# Patient Record
Sex: Female | Born: 1960 | Race: White | Hispanic: No | Marital: Married | Smoking: Never smoker
Health system: Southern US, Community
[De-identification: ages and names within clinical notes are randomized; demographics above are authoritative.]

## PROBLEM LIST (undated history)

## (undated) DIAGNOSIS — R928 Other abnormal and inconclusive findings on diagnostic imaging of breast: Secondary | ICD-10-CM

## (undated) DIAGNOSIS — R51 Headache: Secondary | ICD-10-CM

## (undated) DIAGNOSIS — R519 Headache, unspecified: Secondary | ICD-10-CM

## (undated) DIAGNOSIS — N6320 Unspecified lump in the left breast, unspecified quadrant: Secondary | ICD-10-CM

## (undated) DIAGNOSIS — F32A Depression, unspecified: Secondary | ICD-10-CM

## (undated) DIAGNOSIS — F419 Anxiety disorder, unspecified: Secondary | ICD-10-CM

## (undated) DIAGNOSIS — F329 Major depressive disorder, single episode, unspecified: Secondary | ICD-10-CM

## (undated) HISTORY — PX: TUBAL LIGATION: SHX77

---

## 1988-12-12 HISTORY — PX: ABDOMINAL HYSTERECTOMY: SHX81

## 2012-10-29 LAB — HM MAMMOGRAPHY

## 2014-07-10 ENCOUNTER — Ambulatory Visit (INDEPENDENT_AMBULATORY_CARE_PROVIDER_SITE_OTHER): Payer: BC Managed Care – PPO | Admitting: Internal Medicine

## 2014-07-10 ENCOUNTER — Encounter: Payer: Self-pay | Admitting: Internal Medicine

## 2014-07-10 VITALS — BP 112/84 | HR 70 | Temp 97.9°F | Ht 64.5 in | Wt 170.0 lb

## 2014-07-10 DIAGNOSIS — N9089 Other specified noninflammatory disorders of vulva and perineum: Secondary | ICD-10-CM

## 2014-07-10 DIAGNOSIS — Z780 Asymptomatic menopausal state: Secondary | ICD-10-CM

## 2014-07-10 DIAGNOSIS — Z0000 Encounter for general adult medical examination without abnormal findings: Secondary | ICD-10-CM

## 2014-07-10 LAB — CBC
HCT: 39.3 % (ref 36.0–46.0)
Hemoglobin: 13.1 g/dL (ref 12.0–15.0)
MCHC: 33.3 g/dL (ref 30.0–36.0)
MCV: 88.6 fl (ref 78.0–100.0)
Platelets: 246 10*3/uL (ref 150.0–400.0)
RBC: 4.44 Mil/uL (ref 3.87–5.11)
RDW: 13 % (ref 11.5–15.5)
WBC: 7.2 10*3/uL (ref 4.0–10.5)

## 2014-07-10 LAB — LIPID PANEL
CHOLESTEROL: 206 mg/dL — AB (ref 0–200)
HDL: 53.4 mg/dL (ref >39.00)
LDL Cholesterol: 137 mg/dL — ABNORMAL HIGH (ref 0–99)
NONHDL: 152.6
Total CHOL/HDL Ratio: 4
Triglycerides: 77 mg/dL (ref 0.0–149.0)
VLDL: 15.4 mg/dL (ref 0.0–40.0)

## 2014-07-10 LAB — COMPREHENSIVE METABOLIC PANEL
ALBUMIN: 3.9 g/dL (ref 3.5–5.2)
ALT: 22 U/L (ref 0–35)
AST: 24 U/L (ref 0–37)
Alkaline Phosphatase: 69 U/L (ref 39–117)
BUN: 12 mg/dL (ref 6–23)
CALCIUM: 9 mg/dL (ref 8.4–10.5)
CO2: 26 meq/L (ref 19–32)
Chloride: 105 mEq/L (ref 96–112)
Creatinine, Ser: 0.7 mg/dL (ref 0.4–1.2)
GFR: 99.11 mL/min (ref >60.00)
GLUCOSE: 75 mg/dL (ref 70–99)
POTASSIUM: 3.7 meq/L (ref 3.5–5.1)
SODIUM: 139 meq/L (ref 135–145)
TOTAL PROTEIN: 7.1 g/dL (ref 6.0–8.3)
Total Bilirubin: 0.5 mg/dL (ref 0.2–1.2)

## 2014-07-10 LAB — TSH: TSH: 0.77 u[IU]/mL (ref 0.35–4.50)

## 2014-07-10 MED ORDER — ESTRADIOL 0.1 MG/24HR TD PTTW: 1.0000 | MEDICATED_PATCH | TRANSDERMAL | Status: DC

## 2014-07-10 NOTE — Progress Notes (Signed)
Pre visit review using our clinic review tool, if applicable. No additional management support is needed unless otherwise documented below in the visit note. 

## 2014-07-10 NOTE — Patient Instructions (Addendum)

## 2014-07-10 NOTE — Assessment & Plan Note (Signed)
Refilled vivelle patch

## 2014-07-10 NOTE — Progress Notes (Signed)
HPI  Pt presents to the clinic today to establish care. She recently moved from [place].  Flu: never Tetanus: > 10 years ago Pap Smear: 2013 Mammogram: 2013 Colon Screening: never Eye Doctor: 2013 Dentist: bianually  She does request a refill of her vivelle patch today.  Additionally, she does have some concerns today about genital herpes. She reports a few weeks back, she went to [facility] for evaluation of a labial lesion. They did a culture which came back inconclusive for HSV. She is wondering today if there is any confirmatory testing that can be done. She has no active lesion at this time.  No past medical history on file.  Current Outpatient Prescriptions  Medication Sig Dispense Refill  . estradiol (VIVELLE-DOT) 0.1 MG/24HR patch Place 1 patch onto the skin 2 (two) times a week.       No current facility-administered medications for this visit.    No Known Allergies  Family History  Problem Relation Age of Onset  . Alcohol abuse Mother   . Heart disease Father     History   Social History  . Marital Status: Married    Spouse Name: N/A    Number of Children: N/A  . Years of Education: N/A   Occupational History  . Not on file.   Social History Main Topics  . Smoking status: Never Smoker   . Smokeless tobacco: Never Used  . Alcohol Use: Yes     Comment: occasional  . Drug Use: Not on file  . Sexual Activity: Not on file   Other Topics Concern  . Not on file   Social History Narrative  . No narrative on file    ROS:  Constitutional: Denies fever, malaise, fatigue, headache or abrupt weight changes.  Respiratory: Denies difficulty breathing, shortness of breath, cough or sputum production.   Cardiovascular: Denies chest pain, chest tightness, palpitations or swelling in the hands or feet.  Gastrointestinal: Denies abdominal pain, bloating, constipation, diarrhea or blood in the stool.  GU: Denies frequency, urgency, pain with urination, blood in urine,  odor or discharge. Skin: Denies redness, rashes, lesions or ulcercations.   No other specific complaints in a complete review of systems (except as listed in HPI above).  PE:  BP 112/84  Pulse 70  Temp(Src) 97.9 F (36.6 C) (Oral)  Ht 5' 4.5" (1.638 m)  Wt 170 lb (77.111 kg)  BMI 28.74 kg/m2  SpO2 98% Wt Readings from Last 3 Encounters:  07/10/14 170 lb (77.111 kg)    General: Appears her stated age, well developed, well nourished in NAD. HEENT: Head: normal shape and size; Eyes: sclera white, no icterus, conjunctiva pink, PERRLA and EOMs intact; Ears: Tm's gray and intact, normal light reflex; Nose: mucosa pink and moist, septum midline; Throat/Mouth: Teeth present, mucosa pink and moist, no lesions or ulcerations noted.  Neck: Normal range of motion. Neck supple, trachea midline. No massses, lumps or thyromegaly present.  Cardiovascular: Normal rate and rhythm. S1,S2 noted.  No murmur, rubs or gallops noted. No JVD or BLE edema. No carotid bruits noted. Pulmonary/Chest: Normal effort and positive vesicular breath sounds. No respiratory distress. No wheezes, rales or ronchi noted.  Abdomen: Soft and nontender. Normal bowel sounds, no bruits noted. No distention or masses noted. Liver, spleen and kidneys non palpable. Musculoskeletal: Normal range of motion. No signs of joint swelling. No difficulty with gait.  Neurological: Alert and oriented. Cranial nerves grossly intact. Coordination normal.  Psychiatric: Mood and affect normal. Behavior is normal.  Judgment and thought content normal.      Assessment and Plan:  Preventative Health Maintenance:  She declines all prophylactic vaccines She does not want to schedule mammogram or colon screening at this time Encouraged her to work on diet and exercise Will check screening labs today including CBC, CMET, TSH and lipids  Labial lesion:  Will check serum HSV level  RTC in 1 year or sooner if needed.

## 2014-07-11 LAB — HSV(HERPES SMPLX)ABS-I+II(IGG+IGM)-BLD
HSV 1 Glycoprotein G Ab, IgG: 5.87 IV — ABNORMAL HIGH
HSV 2 GLYCOPROTEIN G AB, IGG: 9.35 IV — AB
Herpes Simplex Vrs I&II-IgM Ab (EIA): 1.35 INDEX — ABNORMAL HIGH

## 2014-09-24 ENCOUNTER — Encounter: Payer: Self-pay | Admitting: Internal Medicine

## 2015-01-13 ENCOUNTER — Telehealth: Payer: Self-pay | Admitting: Internal Medicine

## 2015-01-13 ENCOUNTER — Ambulatory Visit (INDEPENDENT_AMBULATORY_CARE_PROVIDER_SITE_OTHER): Payer: BC Managed Care – PPO | Admitting: Family Medicine

## 2015-01-13 ENCOUNTER — Encounter: Payer: Self-pay | Admitting: Family Medicine

## 2015-01-13 VITALS — BP 120/78 | HR 68 | Temp 97.9°F | Wt 174.5 lb

## 2015-01-13 DIAGNOSIS — M546 Pain in thoracic spine: Secondary | ICD-10-CM

## 2015-01-13 MED ORDER — CYCLOBENZAPRINE HCL 10 MG PO TABS: 5.0000 mg | ORAL_TABLET | Freq: Three times a day (TID) | ORAL | Status: DC | PRN

## 2015-01-13 NOTE — Progress Notes (Signed)
Pre visit review using our clinic review tool, if applicable. No additional management support is needed unless otherwise documented below in the visit note.   Mid pain back, B, horizontal distributions.  Last 3 days.  7/10 pain now, was worse prev.  Aleve didn't help much.  Now with L trap pain.  No FCNAVD.  No anxiety/panic sx.  No CP, no SOB.  No rash, no trauma.  Heat helps temporarily.  No dysuria.  No leg sx, no weakness.   Meds, vitals, and allergies reviewed.   ROS: See HPI.  Otherwise, noncontributory.  nad ncat Neck supple, but L trap and SCM ttp, no LA rrr ctab abd soft, not ttp Ext w/o edema B mid back with muscle spasms noted but no midline pain.  Normal S/S BLE

## 2015-01-13 NOTE — Patient Instructions (Signed)
Keep taking aleve with food, gently stretch, use the heating pad and take flexeril if needed.  It can make you drowsy.  This should gradually get better. Take care.

## 2015-01-13 NOTE — Telephone Encounter (Signed)
PLEASE NOTE: All timestamps contained within this report are represented as [place] Standard Time. CONFIDENTIALTY NOTICE: This fax transmission is intended only for the addressee. It contains information that is legally privileged, confidential or otherwise protected from use or disclosure. If you are not the intended recipient, you are strictly prohibited from reviewing, disclosing, copying using or disseminating any of this information or taking any action in reliance on or regarding this information. If you have received this fax in error, please notify us immediately by telephone so that we can arrange for its return to [place]. Phone: [phone], Toll-Free: [phone], Fax: [phone] Page: 1 of 2 Call Id: [id number] [place] Patient Name: Megan Mcdonald Gender: Female DOB: 05/31/1961 Age: 54 Y 17 M 20 D Return Phone Number: [phone] (Primary) Address: [address] City/State/Zip: [place] [address] Client [facility] - Client Client Site [place], [place] Type Call Call Type Triage / Clinical Relationship To Patient Self Appointment Disposition EMR Appointment Scheduled Return Phone Number [phone] (Primary) Chief Complaint Back Injury Initial Comment Caller states she has severe back pain, neck, and jaw pain. PreDisposition Go to Urgent Care/Walk-In Clinic Info pasted into Epic Yes Nurse Assessment Nurse: Mallie Mussel, RN, Alveta Heimlich Date/Time Eilene Ghazi Time): 01/13/2015 11:05:37 AM Confirm and document reason for call. If symptomatic, describe symptoms. ---Caller states that she has mid back pain for the past 3 days. It goes across her back, not up and down. She has left sided neck and jaw pain with this this morning. She denies back injury. She rates her pain as 7 on 0-10 scale but it has been  worse. She denies shooting pains and numbness in her arms. She denies unusual breathing and unusual sweating. Denies MI and Angina. She is able to touch her chin to her chest. Has the patient traveled out of the country within the last 30 days? ---No Does the patient require triage? ---Yes Related visit to physician within the last 2 weeks? ---No Does the PT have any chronic conditions? (i.e. diabetes, asthma, etc.) ---Yes List chronic conditions. ---Anxiety, HRT, Herpes Did the patient indicate they were pregnant? ---No Guidelines Guideline Title Affirmed Question Affirmed Notes Nurse Date/Time (Eastern Time) Back Pain [1] MODERATE back pain (e.g., interferes with normal activities) AND [2] present > 3 days Orlene Och 01/13/2015 11:11:38 AM Disp. Time Eilene Ghazi Time) Disposition Final User PLEASE NOTE: All timestamps contained within this report are represented as [place] Standard Time. CONFIDENTIALTY NOTICE: This fax transmission is intended only for the addressee. It contains information that is legally privileged, confidential or otherwise protected from use or disclosure. If you are not the intended recipient, you are strictly prohibited from reviewing, disclosing, copying using or disseminating any of this information or taking any action in reliance on or regarding this information. If you have received this fax in error, please notify us immediately by telephone so that we can arrange for its return to [place]. Phone: [phone], Toll-Free: [phone], Fax: [phone] Page: 2 of 2 Call Id: [id number] 01/13/2015 11:15:23 AM See PCP When Office is Open (within 3 days) Yes Mallie Mussel, RN, Ola Spurr Understands: Yes Disagree/Comply: Comply Care Advice Given Per Guideline SEE PCP WITHIN 3 DAYS: You need to be examined within 2 or 3 days. Call your doctor during regular office hours and make an appointment. (Note: if office will be open tomorrow, tell  caller to call then, not in 3  days). LOCAL HEAT: Apply a heating pad or hot water bottle to the most painful area for 20 minutes whenever the pain flares up. (Caution about burns.) SLEEP: Sleep on your side with a pillow between your knees. If you sleep on your back, place a pillow under your knees. Avoid sleeping on the abdomen. The mattress should be firm or reinforced with a board. Avoid waterbeds. * Continue ordinary activities as much as your pain permits. Continued activity is more healing for the back than rest. * Avoid any activities that cause severe pain. * Use caution and commonsense when performing heavy lifting. Similarly, be careful during strenuous exercise. IBUPROFEN (E.G., MOTRIN, ADVIL): NAPROXEN (E.G., ALEVE): * Do not take nonsteroidal anti-inflammatory drugs (NSAIDs) if you have stomach problems, kidney disease, heart failure, or other contraindications to using this type of medication. * Do not take NSAID medications for over 7 days without consulting your PCP. * GASTROINTESTINAL RISK: There is an increased risk of stomach ulcers, GI bleeding, perforation. * CARDIOVASCULAR RISK: There may be an increased risk of heart attack and stroke. * Numbness or weakness occurs, or bowel/bladder problems * There are any urine symptoms or fever * You become worse. After Care Instructions Given Call Event Type User Date / Time Description

## 2015-01-13 NOTE — Telephone Encounter (Signed)
Pt has appt with Dr Damita Dunnings today at 12:30 pm.

## 2015-01-13 NOTE — Telephone Encounter (Signed)
Patient Name: Megan Mcdonald  DOB: 1960-12-23    Initial Comment Caller states she has severe back pain, neck, and jaw pain.    Nurse Assessment  Nurse: Mallie Mussel, RN, Alveta Heimlich Date/Time Eilene Ghazi Time): 01/13/2015 11:05:37 AM  Confirm and document reason for call. If symptomatic, describe symptoms. ---Caller states that she has mid back pain for the past 3 days. It goes across her back, not up and down. She has left sided neck and jaw pain with this this morning. She denies back injury. She rates her pain as 7 on 0-10 scale but it has been worse. She denies shooting pains and numbness in her arms. She denies unusual breathing and unusual sweating. Denies MI and Angina. She is able to touch her chin to her chest.  Has the patient traveled out of the country within the last 30 days? ---No  Does the patient require triage? ---Yes  Related visit to physician within the last 2 weeks? ---No  Does the PT have any chronic conditions? (i.e. diabetes, asthma, etc.) ---Yes  List chronic conditions. ---Anxiety, HRT, Herpes  Did the patient indicate they were pregnant? ---No     Guidelines    Guideline Title Affirmed Question Affirmed Notes  Back Pain [1] MODERATE back pain (e.g., interferes with normal activities) AND [2] present > 3 days    Final Disposition User   See PCP When Office is Open (within 3 days) Mallie Mussel, Therapist, sports, Alveta Heimlich

## 2015-01-13 NOTE — Telephone Encounter (Signed)
OV pending.  

## 2015-01-14 DIAGNOSIS — M549 Dorsalgia, unspecified: Secondary | ICD-10-CM | POA: Insufficient documentation

## 2015-01-14 NOTE — Assessment & Plan Note (Signed)
Muscle spasms likely, d/w pt.  No need to image.  Can use flexeril prn, local heat and massage, can use nsaids otc with Gi caution.  F/u prn.  She agrees.

## 2015-04-27 ENCOUNTER — Telehealth: Payer: Self-pay | Admitting: Internal Medicine

## 2015-04-27 ENCOUNTER — Encounter: Payer: Self-pay | Admitting: Internal Medicine

## 2015-04-27 ENCOUNTER — Ambulatory Visit (INDEPENDENT_AMBULATORY_CARE_PROVIDER_SITE_OTHER): Payer: BC Managed Care – PPO | Admitting: Internal Medicine

## 2015-04-27 VITALS — BP 122/84 | HR 84 | Temp 97.8°F | Wt 177.0 lb

## 2015-04-27 DIAGNOSIS — M62830 Muscle spasm of back: Secondary | ICD-10-CM | POA: Diagnosis not present

## 2015-04-27 MED ORDER — KETOROLAC TROMETHAMINE 30 MG/ML IJ SOLN
30.0000 mg | Freq: Once | INTRAMUSCULAR | Status: AC
  Administered 2015-04-27: 30 mg via INTRAMUSCULAR

## 2015-04-27 MED ORDER — DIAZEPAM 2 MG PO TABS: 2.0000 mg | ORAL_TABLET | Freq: Three times a day (TID) | ORAL | Status: DC | PRN

## 2015-04-27 NOTE — Progress Notes (Signed)
Subjective:    Patient ID: Megan Mcdonald, female    DOB: 23-May-1961, 54 y.o.   MRN: [id number]  HPI  Pt presents to the clinic today with c/o pain in her right upper back. This started all of a sudden yesterday. The pain radiates around her chest to underneath her right breast. The pain is constant but reports it has periods where it is more intense than others. The pain is worse with movement and taking deep breaths. Nothing seems to make it better. She has tried Aleve, Xanax and Flexeril without any relief. She has seen Dr. Damita Dunnings and been to the ER multiple times for the same thing. She reports she has been told it is anxiety, but this is not typically what her anxiety feels like. She denies coughing or trauma to the chest. She denies left sided chest pain, shortness of breath or RUQ abdominal pain.  Review of Systems  History reviewed. No pertinent past medical history.  Current Outpatient Prescriptions  Medication Sig Dispense Refill  . cyclobenzaprine (FLEXERIL) 10 MG tablet Take 0.5-1 tablets (5-10 mg total) by mouth 3 (three) times daily as needed for muscle spasms (sedation caution). 30 tablet 0  . estradiol (VIVELLE-DOT) 0.1 MG/24HR patch Place 1 patch (0.1 mg total) onto the skin 2 (two) times a week. 8 patch 11  . diazepam (VALIUM) 2 MG tablet Take 1 tablet (2 mg total) by mouth every 8 (eight) hours as needed for anxiety. 30 tablet 0   No current facility-administered medications for this visit.    No Known Allergies  Family History  Problem Relation Age of Onset  . Alcohol abuse Mother   . Heart disease Father   . Cancer Neg Hx   . Diabetes Neg Hx   . Stroke Neg Hx     History   Social History  . Marital Status: Married    Spouse Name: N/A  . Number of Children: N/A  . Years of Education: N/A   Occupational History  . Not on file.   Social History Main Topics  . Smoking status: Never Smoker   . Smokeless tobacco: Never Used  . Alcohol Use: Yes   Comment: occasional  . Drug Use: No  . Sexual Activity: Not Currently   Other Topics Concern  . Not on file   Social History Narrative     Constitutional: Denies fever, malaise, fatigue, headache or abrupt weight changes.  HEENT: Denies eye pain, eye redness, ear pain, ringing in the ears, wax buildup, runny nose, nasal congestion, bloody nose, or sore throat. Respiratory: Denies difficulty breathing, shortness of breath, cough or sputum production.   Cardiovascular: Denies chest pain, chest tightness, palpitations or swelling in the hands or feet. . Musculoskeletal: Pt reports back pain. Denies difficulty with gait, or joint pain and swelling.  Skin: Denies redness, rashes, lesions or ulcercations.     No other specific complaints in a complete review of systems (except as listed in HPI above).     Objective:   Physical Exam   BP 122/84 mmHg  Pulse 84  Temp(Src) 97.8 F (36.6 C) (Oral)  Wt 177 lb (80.287 kg)  SpO2 99% Wt Readings from Last 3 Encounters:  04/27/15 177 lb (80.287 kg)  01/13/15 174 lb 8 oz (79.153 kg)  07/10/14 170 lb (77.111 kg)    General: Appears her stated age, well developed, well nourished in NAD. Cardiovascular: Normal rate and rhythm. S1,S2 noted.  No murmur, rubs or gallops noted.  Pulmonary/Chest:  Normal effort and positive vesicular breath sounds. No respiratory distress. No wheezes, rales or ronchi noted.  Musculoskeletal: Normal flexion and extension of the spine. She has pain with rotation. Some pain with palpation over the thoracic spine. Tension noted in the right parathorcic muscles. Pain reproduced with palpation. She is unable to perform internal and external rotation of the right shoulder. Grip strength equal. Neurological: Alert and oriented.  Psychiatric: She is very tearful today.  BMET    Component Value Date/Time   NA 139 07/10/2014 1347   K 3.7 07/10/2014 1347   CL 105 07/10/2014 1347   CO2 26 07/10/2014 1347   GLUCOSE 75  07/10/2014 1347   BUN 12 07/10/2014 1347   CREATININE 0.7 07/10/2014 1347   CALCIUM 9.0 07/10/2014 1347    Lipid Panel     Component Value Date/Time   CHOL 206* 07/10/2014 1347   TRIG 77.0 07/10/2014 1347   HDL 53.40 07/10/2014 1347   CHOLHDL 4 07/10/2014 1347   VLDL 15.4 07/10/2014 1347   LDLCALC 137* 07/10/2014 1347    CBC    Component Value Date/Time   WBC 7.2 07/10/2014 1347   RBC 4.44 07/10/2014 1347   HGB 13.1 07/10/2014 1347   HCT 39.3 07/10/2014 1347   PLT 246.0 07/10/2014 1347   MCV 88.6 07/10/2014 1347   MCHC 33.3 07/10/2014 1347   RDW 13.0 07/10/2014 1347    Hgb A1C No results found for: HGBA1C      Assessment & Plan:   Muscles spasms of right upper back:  She seems to be in a lot of pain today Toradol 30 mg IM today eRx for Valium 2 mg TID x 1 week, advised her not to take with the Xanax Stretching exercises given  RTC as needed or if symptoms persist or worsen

## 2015-04-27 NOTE — Patient Instructions (Signed)
Back Exercises These exercises may help you when beginning to rehabilitate your injury. Your symptoms may resolve with or without further involvement from your physician, physical therapist or athletic trainer. While completing these exercises, remember:   Restoring tissue flexibility helps normal motion to return to the joints. This allows healthier, less painful movement and activity.  An effective stretch should be held for at least 30 seconds.  A stretch should never be painful. You should only feel a gentle lengthening or release in the stretched tissue. STRETCH - Extension, Prone on Elbows   Lie on your stomach on the floor, a bed will be too soft. Place your palms about shoulder width apart and at the height of your head.  Place your elbows under your shoulders. If this is too painful, stack pillows under your chest.  Allow your body to relax so that your hips drop lower and make contact more completely with the floor.  Hold this position for __________ seconds.  Slowly return to lying flat on the floor. Repeat __________ times. Complete this exercise __________ times per day.  RANGE OF MOTION - Extension, Prone Press Ups   Lie on your stomach on the floor, a bed will be too soft. Place your palms about shoulder width apart and at the height of your head.  Keeping your back as relaxed as possible, slowly straighten your elbows while keeping your hips on the floor. You may adjust the placement of your hands to maximize your comfort. As you gain motion, your hands will come more underneath your shoulders.  Hold this position __________ seconds.  Slowly return to lying flat on the floor. Repeat __________ times. Complete this exercise __________ times per day.  RANGE OF MOTION- Quadruped, Neutral Spine   Assume a hands and knees position on a firm surface. Keep your hands under your shoulders and your knees under your hips. You may place padding under your knees for  comfort.  Drop your head and point your tail bone toward the ground below you. This will round out your low back like an angry cat. Hold this position for __________ seconds.  Slowly lift your head and release your tail bone so that your back sags into a large arch, like an old horse.  Hold this position for __________ seconds.  Repeat this until you feel limber in your low back.  Now, find your "sweet spot." This will be the most comfortable position somewhere between the two previous positions. This is your neutral spine. Once you have found this position, tense your stomach muscles to support your low back.  Hold this position for __________ seconds. Repeat __________ times. Complete this exercise __________ times per day.  STRETCH - Flexion, Single Knee to Chest   Lie on a firm bed or floor with both legs extended in front of you.  Keeping one leg in contact with the floor, bring your opposite knee to your chest. Hold your leg in place by either grabbing behind your thigh or at your knee.  Pull until you feel a gentle stretch in your low back. Hold __________ seconds.  Slowly release your grasp and repeat the exercise with the opposite side. Repeat __________ times. Complete this exercise __________ times per day.  STRETCH - Hamstrings, Standing  Stand or sit and extend your right / left leg, placing your foot on a chair or foot stool  Keeping a slight arch in your low back and your hips straight forward.  Lead with your chest and   lean forward at the waist until you feel a gentle stretch in the back of your right / left knee or thigh. (When done correctly, this exercise requires leaning only a small distance.)  Hold this position for __________ seconds. Repeat __________ times. Complete this stretch __________ times per day. STRENGTHENING - Deep Abdominals, Pelvic Tilt   Lie on a firm bed or floor. Keeping your legs in front of you, bend your knees so they are both pointed  toward the ceiling and your feet are flat on the floor.  Tense your lower abdominal muscles to press your low back into the floor. This motion will rotate your pelvis so that your tail bone is scooping upwards rather than pointing at your feet or into the floor.  With a gentle tension and even breathing, hold this position for __________ seconds. Repeat __________ times. Complete this exercise __________ times per day.  STRENGTHENING - Abdominals, Crunches   Lie on a firm bed or floor. Keeping your legs in front of you, bend your knees so they are both pointed toward the ceiling and your feet are flat on the floor. Cross your arms over your chest.  Slightly tip your chin down without bending your neck.  Tense your abdominals and slowly lift your trunk high enough to just clear your shoulder blades. Lifting higher can put excessive stress on the low back and does not further strengthen your abdominal muscles.  Control your return to the starting position. Repeat __________ times. Complete this exercise __________ times per day.  STRENGTHENING - Quadruped, Opposite UE/LE Lift   Assume a hands and knees position on a firm surface. Keep your hands under your shoulders and your knees under your hips. You may place padding under your knees for comfort.  Find your neutral spine and gently tense your abdominal muscles so that you can maintain this position. Your shoulders and hips should form a rectangle that is parallel with the floor and is not twisted.  Keeping your trunk steady, lift your right hand no higher than your shoulder and then your left leg no higher than your hip. Make sure you are not holding your breath. Hold this position __________ seconds.  Continuing to keep your abdominal muscles tense and your back steady, slowly return to your starting position. Repeat with the opposite arm and leg. Repeat __________ times. Complete this exercise __________ times per day. Document Released:  12/16/2005 Document Revised: 02/20/2012 Document Reviewed: 03/12/2009 Exit[employer] Patient Information 2015 [employer]. This information is not intended to replace advice given to you by your health care provider. Make sure you discuss any questions you have with your health care provider.  

## 2015-04-27 NOTE — Telephone Encounter (Signed)
Patient Name: ZENYA HICKAM DOB: Dec 31, 1960 Initial Comment caller states has pain in back going into chest, states not heart; wants appt today; Nurse Assessment Nurse: Marcelline Deist, RN, Kermit Balo Date/Time (Eastern Time): 04/27/2015 9:35:43 AM Confirm and document reason for call. If symptomatic, describe symptoms. ---Caller states has pain in back going into chest, states not heart. Wants appt today. When caller reached, she states she has an appt. for this afternoon. Has the patient traveled out of the country within the last 30 days? ---Not Applicable Does the patient require triage? ---Declined Triage Please document clinical information provided and list any resource used. ---Nurse triage declined at this time. She states she has an appt. for today. No questions for nurse. Guidelines Guideline Title Affirmed Question Affirmed Notes Final Disposition User Clinical Call [place], RN, Kermit Balo

## 2015-04-27 NOTE — Addendum Note (Signed)
Addended by: Lurlean Nanny on: 04/27/2015 04:25 PM   Modules accepted: Orders

## 2015-04-27 NOTE — Telephone Encounter (Signed)
Appointment at 1:15p today.

## 2015-06-11 ENCOUNTER — Telehealth: Payer: Self-pay

## 2015-06-11 ENCOUNTER — Other Ambulatory Visit: Payer: Self-pay | Admitting: Internal Medicine

## 2015-06-11 DIAGNOSIS — A6000 Herpesviral infection of urogenital system, unspecified: Secondary | ICD-10-CM

## 2015-06-11 MED ORDER — VALACYCLOVIR HCL 500 MG PO TABS: ORAL_TABLET | ORAL | Status: DC

## 2015-06-11 NOTE — Telephone Encounter (Signed)
Valtrex has been sent to the pharmacy

## 2015-06-11 NOTE — Telephone Encounter (Signed)
Pt left v/m requesting status of refill for valtrex requested by [facility] Do not see valtrex on med list; spoke with [facility] [address] and advised had sent refill to Dr Dola Argyle at [place] with no response. Spoke with pt; she said when she established with [name] NP 06/2014 pt had just been seen at [facility] with outbreak of genital herpes; pt does not have outbreak now but when gets stressed may have outbreak and pt having a lot of stress at work now. Pt request refill valacyclovir 500 mg # 30 with instructions take 1 tab po bid for 3 days when needed for outbreak. [address]Please advise.

## 2015-07-20 ENCOUNTER — Other Ambulatory Visit: Payer: Self-pay | Admitting: Internal Medicine

## 2015-09-26 ENCOUNTER — Other Ambulatory Visit: Payer: Self-pay | Admitting: Internal Medicine

## 2015-10-23 ENCOUNTER — Telehealth: Payer: Self-pay

## 2015-10-23 NOTE — Telephone Encounter (Signed)
Megan Mcdonald called the office and left a message with the after hours answering service, she requested a morning appt on 11/29 for a mammogram. Returned phone call to [name] to inform her that we do not do mammograms in the office, no answer, left voicemail, instructing her to call the office back so that I could give her contact information of places she could get this procedure done at.

## 2015-11-09 ENCOUNTER — Encounter: Payer: Self-pay | Admitting: Internal Medicine

## 2015-11-09 ENCOUNTER — Ambulatory Visit (INDEPENDENT_AMBULATORY_CARE_PROVIDER_SITE_OTHER): Payer: BC Managed Care – PPO | Admitting: Internal Medicine

## 2015-11-09 VITALS — BP 122/84 | HR 73 | Temp 97.9°F | Wt 175.0 lb

## 2015-11-09 DIAGNOSIS — J0100 Acute maxillary sinusitis, unspecified: Secondary | ICD-10-CM | POA: Diagnosis not present

## 2015-11-09 MED ORDER — AMOXICILLIN-POT CLAVULANATE 875-125 MG PO TABS: 1.0000 | ORAL_TABLET | Freq: Two times a day (BID) | ORAL | Status: DC

## 2015-11-09 MED ORDER — HYDROCODONE-HOMATROPINE 5-1.5 MG/5ML PO SYRP: 5.0000 mL | ORAL_SOLUTION | Freq: Three times a day (TID) | ORAL | Status: DC | PRN

## 2015-11-09 NOTE — Progress Notes (Signed)
Pre visit review using our clinic review tool, if applicable. No additional management support is needed unless otherwise documented below in the visit note. 

## 2015-11-09 NOTE — Patient Instructions (Signed)

## 2015-11-09 NOTE — Progress Notes (Signed)
HPI  Pt presents to the clinic today with c/o cough. This started 10 days ago. The cough is productive of green mucous at times. She reports the mucous is mostly clear at this time. She also reports left side facial pain and pressure, left ear pressure. She denies fever, chills or body aches. She has tried Mucinex, Sudafed and Nyquil without much relief. She has had sick contacts diagnosed with pneumonia. She does not take the flu shot. She has no history of seasonal allergies.  Review of Systems     No past medical history on file.  Family History  Problem Relation Age of Onset  . Alcohol abuse Mother   . Heart disease Father   . Cancer Neg Hx   . Diabetes Neg Hx   . Stroke Neg Hx     Social History   Social History  . Marital Status: Married    Spouse Name: N/A  . Number of Children: N/A  . Years of Education: N/A   Occupational History  . Not on file.   Social History Main Topics  . Smoking status: Never Smoker   . Smokeless tobacco: Never Used  . Alcohol Use: Yes     Comment: occasional  . Drug Use: No  . Sexual Activity: Not Currently   Other Topics Concern  . Not on file   Social History Narrative    No Known Allergies   Constitutional: Positive headache, fatigue. Denies fever or abrupt weight changes.  HEENT:  Positive ear fullness. Denies eye redness, eye pain, pressure behind the eyes, facial pain, nasal congestion, ear pain, ringing in the ears, wax buildup, runny nose or sore throat. Respiratory: Positive cough. Denies difficulty breathing or shortness of breath.  Cardiovascular: Denies chest pain, chest tightness, palpitations or swelling in the hands or feet.   No other specific complaints in a complete review of systems (except as listed in HPI above).  Objective:   BP 122/84 mmHg  Pulse 73  Temp(Src) 97.9 F (36.6 C) (Oral)  Wt 175 lb (79.379 kg)  SpO2 98%  Wt Readings from Last 3 Encounters:  04/27/15 177 lb (80.287 kg)  01/13/15 174 lb  8 oz (79.153 kg)  07/10/14 170 lb (77.111 kg)     General: Appears her stated age, ill appearing in NAD. HEENT: Head: normal shape and size, left maxillary sinus tenderness noted; Eyes: sclera white, no icterus, conjunctiva pink; Ears: Tm's pink but intact, normal light reflex, + serous effusion noted on the left; Nose: mucosa pink and moist, septum midline; Throat/Mouth: + PND. Teeth present, mucosa erythematous and moist, no exudate noted, no lesions or ulcerations noted.  Neck: No cervical lymphadenopathy.  Cardiovascular: Normal rate and rhythm. S1,S2 noted.  No murmur, rubs or gallops noted.  Pulmonary/Chest: Normal effort and positive vesicular breath sounds. No respiratory distress. No wheezes, rales or ronchi noted.      Assessment & Plan:   Acute sinusitis:  Get some rest and drink plenty of water Can use a [employer] OTC eRx for Augmentin BID x 10 days Rx for Hycodan cough syrup  RTC as needed or if symptoms persist.

## 2015-11-21 ENCOUNTER — Other Ambulatory Visit: Payer: Self-pay | Admitting: Internal Medicine

## 2015-12-01 ENCOUNTER — Telehealth: Payer: Self-pay

## 2015-12-01 NOTE — Telephone Encounter (Signed)
That has been almost 2 weeks ago. Have her try Flonase. She may need to go to [facility] in [place] for reevaluation.

## 2015-12-01 NOTE — Telephone Encounter (Signed)
Pt left v/m; pt was last seen 11/09/15; 3 days after finished abx all symptoms returned; pt is in [place] and request med called to [facility] [phone]. Pt request cb.Please advise.

## 2015-12-02 NOTE — Telephone Encounter (Signed)
Left detailed msg on VM per HIPAA  

## 2016-05-13 ENCOUNTER — Other Ambulatory Visit: Payer: Self-pay | Admitting: Internal Medicine

## 2016-06-07 ENCOUNTER — Other Ambulatory Visit: Payer: Self-pay

## 2016-06-07 MED ORDER — ESTRADIOL 0.1 MG/24HR TD PTTW: 1.0000 | MEDICATED_PATCH | TRANSDERMAL | Status: DC

## 2016-06-07 NOTE — Telephone Encounter (Signed)
Yes, will get pap. Patch refilled.

## 2016-06-07 NOTE — Telephone Encounter (Signed)
Pt has scheduled [facility] 09/20/16;pt established care 07/10/14; pt request refill estradiol at [place] [address] until [facility]. Pt has not had pap smear in years and wants to know if will get pap at [facility] in Oct. Pt request cb.

## 2016-09-20 ENCOUNTER — Encounter: Payer: Self-pay | Admitting: Internal Medicine

## 2016-09-20 ENCOUNTER — Encounter (INDEPENDENT_AMBULATORY_CARE_PROVIDER_SITE_OTHER): Payer: Self-pay

## 2016-09-20 ENCOUNTER — Ambulatory Visit (INDEPENDENT_AMBULATORY_CARE_PROVIDER_SITE_OTHER): Payer: BC Managed Care – PPO | Admitting: Internal Medicine

## 2016-09-20 VITALS — BP 118/76 | HR 87 | Temp 98.1°F | Ht 65.0 in | Wt 177.5 lb

## 2016-09-20 DIAGNOSIS — Z23 Encounter for immunization: Secondary | ICD-10-CM | POA: Diagnosis not present

## 2016-09-20 DIAGNOSIS — Z0000 Encounter for general adult medical examination without abnormal findings: Secondary | ICD-10-CM | POA: Diagnosis not present

## 2016-09-20 DIAGNOSIS — Z1159 Encounter for screening for other viral diseases: Secondary | ICD-10-CM | POA: Diagnosis not present

## 2016-09-20 DIAGNOSIS — Z114 Encounter for screening for human immunodeficiency virus [HIV]: Secondary | ICD-10-CM | POA: Diagnosis not present

## 2016-09-20 DIAGNOSIS — Z1231 Encounter for screening mammogram for malignant neoplasm of breast: Secondary | ICD-10-CM | POA: Diagnosis not present

## 2016-09-20 DIAGNOSIS — Z1239 Encounter for other screening for malignant neoplasm of breast: Secondary | ICD-10-CM

## 2016-09-20 DIAGNOSIS — Z111 Encounter for screening for respiratory tuberculosis: Secondary | ICD-10-CM

## 2016-09-20 LAB — COMPREHENSIVE METABOLIC PANEL
ALT: 21 U/L (ref 0–35)
AST: 19 U/L (ref 0–37)
Albumin: 3.9 g/dL (ref 3.5–5.2)
Alkaline Phosphatase: 67 U/L (ref 39–117)
BUN: 15 mg/dL (ref 6–23)
CHLORIDE: 106 meq/L (ref 96–112)
CO2: 30 mEq/L (ref 19–32)
Calcium: 9.4 mg/dL (ref 8.4–10.5)
Creatinine, Ser: 0.94 mg/dL (ref 0.40–1.20)
GFR: 65.37 mL/min (ref >60.00)
GLUCOSE: 79 mg/dL (ref 70–99)
POTASSIUM: 3.3 meq/L — AB (ref 3.5–5.1)
SODIUM: 143 meq/L (ref 135–145)
Total Bilirubin: 0.3 mg/dL (ref 0.2–1.2)
Total Protein: 7 g/dL (ref 6.0–8.3)

## 2016-09-20 LAB — CBC
HCT: 41.1 % (ref 36.0–46.0)
Hemoglobin: 13.7 g/dL (ref 12.0–15.0)
MCHC: 33.3 g/dL (ref 30.0–36.0)
MCV: 87.8 fl (ref 78.0–100.0)
Platelets: 280 10*3/uL (ref 150.0–400.0)
RBC: 4.68 Mil/uL (ref 3.87–5.11)
RDW: 13 % (ref 11.5–15.5)
WBC: 8.7 10*3/uL (ref 4.0–10.5)

## 2016-09-20 LAB — LIPID PANEL
Cholesterol: 207 mg/dL — ABNORMAL HIGH (ref 0–200)
HDL: 60 mg/dL (ref >39.00)
LDL CALC: 120 mg/dL — AB (ref 0–99)
NonHDL: 147.07
Total CHOL/HDL Ratio: 3
Triglycerides: 135 mg/dL (ref 0.0–149.0)
VLDL: 27 mg/dL (ref 0.0–40.0)

## 2016-09-20 NOTE — Addendum Note (Signed)
Addended by: Lurlean Nanny on: 09/20/2016 04:45 PM   Modules accepted: Orders

## 2016-09-20 NOTE — Patient Instructions (Signed)

## 2016-09-20 NOTE — Progress Notes (Signed)
Subjective:    Patient ID: Megan Mcdonald, female    DOB: [birth year], 55 y.o.   MRN: [id number]  HPI  Pt presents to the clinic today for her annual exam.  Flu: never Tetanus: > 10 years ago Pap Smear: 2013 Mammogram: 10/2012 Colon Screening: never Vision Screening: yearly Dentist: yearly  Diet: She does eat meat. She consumes fruits and veggies daily. She does eat some fried food. She drinks mostly water. Exercise: None   Review of Systems      No past medical history on file.  Current Outpatient Prescriptions  Medication Sig Dispense Refill  . cyclobenzaprine (FLEXERIL) 10 MG tablet Take 0.5-1 tablets (5-10 mg total) by mouth 3 (three) times daily as needed for muscle spasms (sedation caution). 30 tablet 0  . diazepam (VALIUM) 2 MG tablet Take 1 tablet (2 mg total) by mouth every 8 (eight) hours as needed for anxiety. 30 tablet 0  . estradiol (VIVELLE-DOT) 0.1 MG/24HR patch Place 1 patch (0.1 mg total) onto the skin 2 (two) times a week. MUST SCHEDULE ANNUAL EXAM FOR MORE REFILLS 8 patch 3  . valACYclovir (VALTREX) 500 MG tablet Take 1 tab BID x 3 days if you have an outbreak 30 tablet 0   No current facility-administered medications for this visit.     No Known Allergies  Family History  Problem Relation Age of Onset  . Alcohol abuse Mother   . Heart disease Father   . Cancer Neg Hx   . Diabetes Neg Hx   . Stroke Neg Hx     Social History   Social History  . Marital status: Married    Spouse name: N/A  . Number of children: N/A  . Years of education: N/A   Occupational History  . Not on file.   Social History Main Topics  . Smoking status: Never Smoker  . Smokeless tobacco: Never Used  . Alcohol use Yes     Comment: occasional  . Drug use: No  . Sexual activity: Not Currently   Other Topics Concern  . Not on file   Social History Narrative  . No narrative on file     Constitutional: Denies fever, malaise, fatigue, headache or abrupt weight  changes.  HEENT: Denies eye pain, eye redness, ear pain, ringing in the ears, wax buildup, runny nose, nasal congestion, bloody nose, or sore throat. Respiratory: Denies difficulty breathing, shortness of breath, cough or sputum production.   Cardiovascular: Denies chest pain, chest tightness, palpitations or swelling in the hands or feet.  Gastrointestinal: Denies abdominal pain, bloating, constipation, diarrhea or blood in the stool.  GU: Denies urgency, frequency, pain with urination, burning sensation, blood in urine, odor or discharge. Musculoskeletal: Denies decrease in range of motion, difficulty with gait, muscle pain or joint pain and swelling.  Skin: Pt reports rash on face. Denies lesions or ulcercations.  Neurological: Denies dizziness, difficulty with memory, difficulty with speech or problems with balance and coordination.  Psych: Pt reports anxiety. Denies depression, SI/HI.  No other specific complaints in a complete review of systems (except as listed in HPI above).  Objective:   Physical Exam    BP 118/76   Pulse 87   Temp 98.1 F (36.7 C) (Oral)   Ht 5\' 5"  (1.651 m)   Wt 177 lb 8 oz (80.5 kg)   SpO2 98%   BMI 29.54 kg/m  Wt Readings from Last 3 Encounters:  09/20/16 177 lb 8 oz (80.5 kg)  11/09/15 175  lb (79.4 kg)  04/27/15 177 lb (80.3 kg)    General: Appears her stated age, well developed, well nourished in NAD. Skin: Warm, dry and intact. Rosacea noted on face. HEENT: Head: normal shape and size; Eyes: sclera white, no icterus, conjunctiva pink, PERRLA and EOMs intact; Ears: Tm's gray and intact, normal light reflex; Throat/Mouth: Teeth present, mucosa pink and moist, no exudate, lesions or ulcerations noted.  Neck:  Neck supple, trachea midline. No masses, lumps or thyromegaly present.  Cardiovascular: Normal rate and rhythm. S1,S2 noted.  No murmur, rubs or gallops noted. No JVD or BLE edema. No carotid bruits noted. Pulmonary/Chest: Normal effort and  positive vesicular breath sounds. No respiratory distress. No wheezes, rales or ronchi noted.  Abdomen: Soft and nontender. Normal bowel sounds. No distention or masses noted. Liver, spleen and kidneys non palpable. Musculoskeletal: Normal range of motion. No signs of joint swelling. No difficulty with gait.  Neurological: Alert and oriented. Cranial nerves II-XII grossly intact. Coordination normal.  Psychiatric: Mood and affect normal. Behavior is normal. Judgment and thought content normal.     BMET    Component Value Date/Time   NA 139 07/10/2014 1347   K 3.7 07/10/2014 1347   CL 105 07/10/2014 1347   CO2 26 07/10/2014 1347   GLUCOSE 75 07/10/2014 1347   BUN 12 07/10/2014 1347   CREATININE 0.7 07/10/2014 1347   CALCIUM 9.0 07/10/2014 1347    Lipid Panel     Component Value Date/Time   CHOL 206 (H) 07/10/2014 1347   TRIG 77.0 07/10/2014 1347   HDL 53.40 07/10/2014 1347   CHOLHDL 4 07/10/2014 1347   VLDL 15.4 07/10/2014 1347   LDLCALC 137 (H) 07/10/2014 1347    CBC    Component Value Date/Time   WBC 7.2 07/10/2014 1347   RBC 4.44 07/10/2014 1347   HGB 13.1 07/10/2014 1347   HCT 39.3 07/10/2014 1347   PLT 246.0 07/10/2014 1347   MCV 88.6 07/10/2014 1347   MCHC 33.3 07/10/2014 1347   RDW 13.0 07/10/2014 1347    Hgb A1C No results found for: HGBA1C      Assessment & Plan:   Preventative Health Maintenance:  She declines flu shot today Tdap today Pap smear due 2018 Mammogram ordered- she will call [facility] to schedule She declines colonoscopy or colguard at this time Encouraged her to consume a balanced diet and exercise regimen Advised her to see an eye doctor and dentist annually Will check CBC, CMET, Lipid, HIV and Hep C today  Rosacea:  She insist she does not have rosacea and declines treatment today  RTC in 1 year, sooner if needed [name], NP

## 2016-09-21 LAB — HIV ANTIBODY (ROUTINE TESTING W REFLEX): HIV 1&2 Ab, 4th Generation: NONREACTIVE

## 2016-09-21 LAB — HEPATITIS C ANTIBODY: HCV Ab: NEGATIVE

## 2016-09-22 LAB — TB SKIN TEST: TB SKIN TEST: NEGATIVE

## 2017-01-30 ENCOUNTER — Other Ambulatory Visit: Payer: Self-pay | Admitting: Internal Medicine

## 2017-01-31 NOTE — Telephone Encounter (Signed)
Pt left v/m at $:50 pm requesting cb about estradiol refill. Please advise.

## 2017-01-31 NOTE — Telephone Encounter (Signed)
Last filled 07/2016--please advise

## 2017-02-01 NOTE — Telephone Encounter (Signed)
Left detailed msg on VM per HIPAA  

## 2017-02-07 ENCOUNTER — Encounter: Payer: Self-pay | Admitting: Internal Medicine

## 2017-02-07 ENCOUNTER — Ambulatory Visit (INDEPENDENT_AMBULATORY_CARE_PROVIDER_SITE_OTHER): Payer: BC Managed Care – PPO | Admitting: Internal Medicine

## 2017-02-07 VITALS — BP 118/80 | HR 100 | Temp 97.9°F | Wt 183.0 lb

## 2017-02-07 DIAGNOSIS — H66002 Acute suppurative otitis media without spontaneous rupture of ear drum, left ear: Secondary | ICD-10-CM

## 2017-02-07 MED ORDER — AMOXICILLIN 875 MG PO TABS: 875.0000 mg | ORAL_TABLET | Freq: Two times a day (BID) | ORAL | 0 refills | Status: DC

## 2017-02-07 NOTE — Progress Notes (Signed)
Subjective:    Patient ID: Megan Mcdonald, female    DOB: 17-Apr-1961, 56 y.o.   MRN: [id number]  HPI  Pt presents to the clinic today with c/o left ear pain. This started 1 month ago. It is intermittent. She describes the pain as pressure or a burning sensation. The pain radiates into her jaw. She denies runny nose, nasal congestion, sore throat or loss of hearing. She has tried Debrox OTC without any relief.  Review of Systems      History reviewed. No pertinent past medical history.  Current Outpatient Prescriptions  Medication Sig Dispense Refill  . cyclobenzaprine (FLEXERIL) 10 MG tablet Take 0.5-1 tablets (5-10 mg total) by mouth 3 (three) times daily as needed for muscle spasms (sedation caution). 30 tablet 0  . diazepam (VALIUM) 2 MG tablet Take 1 tablet (2 mg total) by mouth every 8 (eight) hours as needed for anxiety. 30 tablet 0  . estradiol (VIVELLE-DOT) 0.1 MG/24HR patch PLACE 1 NEW PATCH ON THE SKIN TWICE A WEEK 8 patch 5  . valACYclovir (VALTREX) 500 MG tablet Take 1 tab BID x 3 days if you have an outbreak 30 tablet 0   No current facility-administered medications for this visit.     No Known Allergies  Family History  Problem Relation Age of Onset  . Alcohol abuse Mother   . Heart disease Father   . Cancer Neg Hx   . Diabetes Neg Hx   . Stroke Neg Hx     Social History   Social History  . Marital status: Married    Spouse name: N/A  . Number of children: N/A  . Years of education: N/A   Occupational History  . Not on file.   Social History Main Topics  . Smoking status: Never Smoker  . Smokeless tobacco: Never Used  . Alcohol use Yes     Comment: occasional  . Drug use: No  . Sexual activity: Not Currently   Other Topics Concern  . Not on file   Social History Narrative  . No narrative on file     Constitutional: Denies fever, malaise, fatigue, headache or abrupt weight changes.  HEENT: Pt reports ear pain. Denies eye pain, eye redness,   ringing in the ears, wax buildup, runny nose, nasal congestion, bloody nose, or sore throat.   No other specific complaints in a complete review of systems (except as listed in HPI above).  Objective:   Physical Exam   BP 118/80   Pulse 100   Temp 97.9 F (36.6 C) (Oral)   Wt 183 lb (83 kg)   SpO2 98%   BMI 30.45 kg/m  Wt Readings from Last 3 Encounters:  02/07/17 183 lb (83 kg)  09/20/16 177 lb 8 oz (80.5 kg)  11/09/15 175 lb (79.4 kg)    General: Appears her stated age, in NAD. HEENT: Right Ear: Cerumen impaction; Ears: Tm's red and bulging, distorted light reflex; Throat/Mouth: Teeth present, mucosa pink and moist, no exudate, lesions or ulcerations noted.  Neck:  No adenopathy noted.  BMET    Component Value Date/Time   NA 143 09/20/2016 1521   K 3.3 (L) 09/20/2016 1521   CL 106 09/20/2016 1521   CO2 30 09/20/2016 1521   GLUCOSE 79 09/20/2016 1521   BUN 15 09/20/2016 1521   CREATININE 0.94 09/20/2016 1521   CALCIUM 9.4 09/20/2016 1521    Lipid Panel     Component Value Date/Time   CHOL 207 (H)  09/20/2016 1521   TRIG 135.0 09/20/2016 1521   HDL 60.00 09/20/2016 1521   CHOLHDL 3 09/20/2016 1521   VLDL 27.0 09/20/2016 1521   LDLCALC 120 (H) 09/20/2016 1521    CBC    Component Value Date/Time   WBC 8.7 09/20/2016 1521   RBC 4.68 09/20/2016 1521   HGB 13.7 09/20/2016 1521   HCT 41.1 09/20/2016 1521   PLT 280.0 09/20/2016 1521   MCV 87.8 09/20/2016 1521   MCHC 33.3 09/20/2016 1521   RDW 13.0 09/20/2016 1521    Hgb A1C No results found for: HGBA1C         Assessment & Plan:   Otitis Media, Left:  Start Flonase daily in am x 1 week eRx for Amoxil 875 mg BID x 10 days  Return precautions discussed [name], NP

## 2017-02-07 NOTE — Patient Instructions (Signed)
Otitis Media, Adult Otitis media is redness, soreness, and puffiness (swelling) in the space just behind your eardrum (middle ear). It may be caused by allergies or infection. It often happens along with a cold. Follow these instructions at home:  Take your medicine as told. Finish it even if you start to feel better.  Only take over-the-counter or prescription medicines for pain, discomfort, or fever as told by your doctor.  Follow up with your doctor as told. Contact a doctor if:  You have otitis media only in one ear, or bleeding from your nose, or both.  You notice a lump on your neck.  You are not getting better in 3-5 days.  You feel worse instead of better. Get help right away if:  You have pain that is not helped with medicine.  You have puffiness, redness, or pain around your ear.  You get a stiff neck.  You cannot move part of your face (paralysis).  You notice that the bone behind your ear hurts when you touch it. This information is not intended to replace advice given to you by your health care provider. Make sure you discuss any questions you have with your health care provider. Document Released: 05/16/2008 Document Revised: 05/05/2016 Document Reviewed: 06/25/2013 Elsevier Interactive Patient Education  2017 Elsevier Inc.  

## 2017-02-27 ENCOUNTER — Other Ambulatory Visit: Payer: Self-pay

## 2017-02-27 MED ORDER — VALACYCLOVIR HCL 500 MG PO TABS: ORAL_TABLET | ORAL | 0 refills | Status: DC

## 2017-02-27 NOTE — Telephone Encounter (Signed)
Pt left v/m requesting refill valacyclovir to have on hand incase of another outbreak to [facility] [address]. Last annual 09/20/16 and last refilled # 30 on 03/11/15.

## 2017-05-24 ENCOUNTER — Ambulatory Visit (INDEPENDENT_AMBULATORY_CARE_PROVIDER_SITE_OTHER): Payer: BC Managed Care – PPO | Admitting: Family Medicine

## 2017-05-24 ENCOUNTER — Encounter: Payer: Self-pay | Admitting: Family Medicine

## 2017-05-24 VITALS — BP 138/90 | HR 96 | Temp 98.0°F | Wt 183.2 lb

## 2017-05-24 DIAGNOSIS — B9789 Other viral agents as the cause of diseases classified elsewhere: Secondary | ICD-10-CM

## 2017-05-24 DIAGNOSIS — J069 Acute upper respiratory infection, unspecified: Secondary | ICD-10-CM | POA: Diagnosis not present

## 2017-05-24 MED ORDER — GUAIFENESIN-CODEINE 100-10 MG/5ML PO SYRP: 5.0000 mL | ORAL_SOLUTION | Freq: Two times a day (BID) | ORAL | 0 refills | Status: DC | PRN

## 2017-05-24 NOTE — Patient Instructions (Signed)
I think you have viral respiratory infection. Push fluids and rest. Use cheratussin codeine cough syrup with sedation precautions. Take plain mucinex with large glass of water to help mobilize mucous.  Watch for fever >101, worsening productive cough, or not improving over 7-10 days. If worsening, let us know sooner.

## 2017-05-24 NOTE — Assessment & Plan Note (Addendum)
Anticipate acute respiratory infection, viral given short duration. Supportive care reviewed. With Rx cheratussin codeine cough syrup at night PRN, rec plain mucinex during day, update if not improving with treatment. Pt agrees with plan

## 2017-05-24 NOTE — Progress Notes (Signed)
BP 138/90   Pulse 96   Temp 98 F (36.7 C) (Oral)   Wt 183 lb 4 oz (83.1 kg)   SpO2 95%   BMI 30.49 kg/m    CC: cough Subjective:    Patient ID: Megan Mcdonald, female    DOB: Sep 16, 1961, 56 y.o.   MRN: [id number]  HPI: Megan Mcdonald is a 56 y.o. female presenting on 05/24/2017 for Cough (non-productive, started Friday night)   5d h/o non productive cough associated with throat congestion. Feverish last night. Headache from coughing.   No fevers/chills, head congestion, dyspnea or wheezing. No sore throat or PNDrainage, ear or tooth pain.   Has tried OTC tussin, nyquil. She also takes flonase for this.  No sick contacts at home. 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  No smokers at home.  No h/o asthma.  Allergic rhinitis overall well managed with zyrtec.  No GERD symptoms.   Relevant past medical, surgical, family and social history reviewed and updated as indicated. Interim medical history since our last visit reviewed. Allergies and medications reviewed and updated. Outpatient Medications Prior to Visit  Medication Sig Dispense Refill  . cyclobenzaprine (FLEXERIL) 10 MG tablet Take 0.5-1 tablets (5-10 mg total) by mouth 3 (three) times daily as needed for muscle spasms (sedation caution). 30 tablet 0  . diazepam (VALIUM) 2 MG tablet Take 1 tablet (2 mg total) by mouth every 8 (eight) hours as needed for anxiety. 30 tablet 0  . estradiol (VIVELLE-DOT) 0.1 MG/24HR patch PLACE 1 NEW PATCH ON THE SKIN TWICE A WEEK 8 patch 5  . valACYclovir (VALTREX) 500 MG tablet Take 1 tab BID x 3 days if you have an outbreak 30 tablet 0  . amoxicillin (AMOXIL) 875 MG tablet Take 1 tablet (875 mg total) by mouth 2 (two) times daily. (Patient not taking: Reported on 05/24/2017) 20 tablet 0   No facility-administered medications prior to visit.      Per HPI unless specifically indicated in ROS section below Review of Systems     Objective:    BP 138/90   Pulse 96   Temp 98 F (36.7 C)  (Oral)   Wt 183 lb 4 oz (83.1 kg)   SpO2 95%   BMI 30.49 kg/m   Wt Readings from Last 3 Encounters:  05/24/17 183 lb 4 oz (83.1 kg)  02/07/17 183 lb (83 kg)  09/20/16 177 lb 8 oz (80.5 kg)    Physical Exam  Constitutional: She appears well-developed and well-nourished. No distress.  HENT:  Head: Normocephalic and atraumatic.  Right Ear: Hearing, tympanic membrane, external ear and ear canal normal.  Left Ear: Hearing, tympanic membrane, external ear and ear canal normal.  Nose: No mucosal edema or rhinorrhea. Right sinus exhibits no maxillary sinus tenderness and no frontal sinus tenderness. Left sinus exhibits no maxillary sinus tenderness and no frontal sinus tenderness.  Mouth/Throat: Uvula is midline, oropharynx is clear and moist and mucous membranes are normal. No oropharyngeal exudate, posterior oropharyngeal edema, posterior oropharyngeal erythema or tonsillar abscesses.  Eyes: Conjunctivae and EOM are normal. Pupils are equal, round, and reactive to light. No scleral icterus.  Neck: Normal range of motion. Neck supple.  Cardiovascular: Normal rate, regular rhythm, normal heart sounds and intact distal pulses.   No murmur heard. Pulmonary/Chest: Effort normal and breath sounds normal. No respiratory distress. She has no wheezes. She has no rales.  Lymphadenopathy:       Head (right side): Preauricular adenopathy  present. No submental, no submandibular, no tonsillar and no posterior auricular adenopathy present.       Head (left side): No submental, no submandibular, no tonsillar, no preauricular and no posterior auricular adenopathy present.    She has cervical adenopathy (R AC LAD).       Right: No supraclavicular adenopathy present.       Left: No supraclavicular adenopathy present.  Skin: Skin is warm and dry. No rash noted.  Nursing note and vitals reviewed.     Assessment & Plan:   Problem List Items Addressed This Visit    Viral URI with cough - Primary     Anticipate acute respiratory infection, viral given short duration. Supportive care reviewed. With Rx cheratussin codeine cough syrup at night PRN, rec plain mucinex during day, update if not improving with treatment. Pt agrees with plan           Follow up plan: Return if symptoms worsen or fail to improve.  Ria Bush, MD

## 2017-06-22 ENCOUNTER — Ambulatory Visit: Payer: Self-pay | Admitting: Internal Medicine

## 2017-06-30 ENCOUNTER — Ambulatory Visit
Admission: RE | Admit: 2017-06-30 | Discharge: 2017-06-30 | Disposition: A | Discharge status: Home / Self Care | Payer: BC Managed Care – PPO | Source: Ambulatory Visit | Attending: Internal Medicine | Admitting: Internal Medicine

## 2017-06-30 DIAGNOSIS — Z1239 Encounter for other screening for malignant neoplasm of breast: Secondary | ICD-10-CM

## 2017-07-18 ENCOUNTER — Other Ambulatory Visit: Payer: Self-pay | Admitting: Internal Medicine

## 2017-07-18 DIAGNOSIS — R928 Other abnormal and inconclusive findings on diagnostic imaging of breast: Secondary | ICD-10-CM

## 2017-07-24 ENCOUNTER — Other Ambulatory Visit: Payer: Self-pay | Admitting: Internal Medicine

## 2017-07-24 ENCOUNTER — Ambulatory Visit
Admission: RE | Admit: 2017-07-24 | Discharge: 2017-07-24 | Disposition: A | Discharge status: Home / Self Care | Payer: BC Managed Care – PPO | Source: Ambulatory Visit | Attending: Internal Medicine | Admitting: Internal Medicine

## 2017-07-24 DIAGNOSIS — R928 Other abnormal and inconclusive findings on diagnostic imaging of breast: Secondary | ICD-10-CM

## 2017-07-24 DIAGNOSIS — N6489 Other specified disorders of breast: Secondary | ICD-10-CM

## 2017-07-24 DIAGNOSIS — N6001 Solitary cyst of right breast: Secondary | ICD-10-CM

## 2017-07-28 ENCOUNTER — Other Ambulatory Visit: Payer: BC Managed Care – PPO

## 2017-08-01 ENCOUNTER — Encounter: Payer: Self-pay | Admitting: Internal Medicine

## 2017-08-01 ENCOUNTER — Ambulatory Visit (INDEPENDENT_AMBULATORY_CARE_PROVIDER_SITE_OTHER): Payer: BC Managed Care – PPO | Admitting: Internal Medicine

## 2017-08-01 VITALS — BP 116/80 | HR 65 | Temp 97.9°F | Wt 189.5 lb

## 2017-08-01 DIAGNOSIS — R609 Edema, unspecified: Secondary | ICD-10-CM

## 2017-08-01 DIAGNOSIS — I872 Venous insufficiency (chronic) (peripheral): Secondary | ICD-10-CM

## 2017-08-01 DIAGNOSIS — R600 Localized edema: Principal | ICD-10-CM

## 2017-08-01 MED ORDER — HYDROCHLOROTHIAZIDE 25 MG PO TABS: 12.5000 mg | ORAL_TABLET | Freq: Every day | ORAL | 2 refills | Status: DC

## 2017-08-01 MED ORDER — VALACYCLOVIR HCL 500 MG PO TABS: ORAL_TABLET | ORAL | 2 refills | Status: DC

## 2017-08-01 NOTE — Patient Instructions (Signed)
Edema Edema is when you have too much fluid in your body or under your skin. Edema may make your legs, feet, and ankles swell up. Swelling is also common in looser tissues, like around your eyes. This is a common condition. It gets more common as you get older. There are many possible causes of edema. Eating too much salt (sodium) and being on your feet or sitting for a long time can cause edema in your legs, feet, and ankles. Hot weather may make edema worse. Edema is usually painless. Your skin may look swollen or shiny. Follow these instructions at home:  Keep the swollen body part raised (elevated) above the level of your heart when you are sitting or lying down.  Do not sit still or stand for a long time.  Do not wear tight clothes. Do not wear garters on your upper legs.  Exercise your legs. This can help the swelling go down.  Wear elastic bandages or support stockings as told by your doctor.  Eat a low-salt (low-sodium) diet to reduce fluid as told by your doctor.  Depending on the cause of your swelling, you may need to limit how much fluid you drink (fluid restriction).  Take over-the-counter and prescription medicines only as told by your doctor. Contact a doctor if:  Treatment is not working.  You have heart, liver, or kidney disease and have symptoms of edema.  You have sudden and unexplained weight gain. Get help right away if:  You have shortness of breath or chest pain.  You cannot breathe when you lie down.  You have pain, redness, or warmth in the swollen areas.  You have heart, liver, or kidney disease and get edema all of a sudden.  You have a fever and your symptoms get worse all of a sudden. Summary  Edema is when you have too much fluid in your body or under your skin.  Edema may make your legs, feet, and ankles swell up. Swelling is also common in looser tissues, like around your eyes.  Raise (elevate) the swollen body part above the level of your  heart when you are sitting or lying down.  Follow your doctor's instructions about diet and how much fluid you can drink (fluid restriction). This information is not intended to replace advice given to you by your health care provider. Make sure you discuss any questions you have with your health care provider. Document Released: 05/16/2008 Document Revised: 12/16/2016 Document Reviewed: 12/16/2016 Elsevier Interactive Patient Education  2017 Elsevier Inc.  

## 2017-08-01 NOTE — Progress Notes (Signed)
Subjective:    Patient ID: Megan Mcdonald, female    DOB: July 19, 1961, 56 y.o.   MRN: [id number]  HPI  Pt presents to clinic today with c/o RLE edema. She reports she first noticed this 2 years ago. Her foot would be swollen at the end of the day, but better by the next morning. Over the last few months, she reports her foot is swollen all day long, even when she first wakes up in the morning. She denies redness, warmth, pain, numbness or tingling. She has tried elevating her foot with minimal relief. She has no history of blood clots, heart failure but she is on HRT. She does consume some salt in her diet.  Review of Systems      No past medical history on file.  Current Outpatient Prescriptions  Medication Sig Dispense Refill  . cyclobenzaprine (FLEXERIL) 10 MG tablet Take 0.5-1 tablets (5-10 mg total) by mouth 3 (three) times daily as needed for muscle spasms (sedation caution). 30 tablet 0  . diazepam (VALIUM) 2 MG tablet Take 1 tablet (2 mg total) by mouth every 8 (eight) hours as needed for anxiety. 30 tablet 0  . estradiol (VIVELLE-DOT) 0.1 MG/24HR patch PLACE 1 NEW PATCH ON THE SKIN TWICE A WEEK 8 patch 5  . guaiFENesin-codeine (CHERATUSSIN AC) 100-10 MG/5ML syrup Take 5 mLs by mouth 2 (two) times daily as needed. 140 mL 0  . Naproxen Sodium (ALEVE) 220 MG CAPS Take by mouth as needed.    . valACYclovir (VALTREX) 500 MG tablet Take 1 tab BID x 3 days if you have an outbreak 30 tablet 2  . hydrochlorothiazide (HYDRODIURIL) 25 MG tablet Take 0.5 tablets (12.5 mg total) by mouth daily. 30 tablet 2   No current facility-administered medications for this visit.     No Known Allergies  Family History  Problem Relation Age of Onset  . Alcohol abuse Mother   . Heart disease Father   . Cancer Neg Hx   . Diabetes Neg Hx   . Stroke Neg Hx     Social History   Social History  . Marital status: Married    Spouse name: N/A  . Number of children: N/A  . Years of education: N/A    Occupational History  . Not on file.   Social History Main Topics  . Smoking status: Never Smoker  . Smokeless tobacco: Never Used  . Alcohol use Yes     Comment: occasional  . Drug use: No  . Sexual activity: Not Currently   Other Topics Concern  . Not on file   Social History Narrative  . No narrative on file     Constitutional: Denies fever, malaise, fatigue, headache or abrupt weight changes.  Respiratory: Denies difficulty breathing, shortness of breath, cough or sputum production.   Cardiovascular: Pt reports RLE edema. Denies chest pain, chest tightness, palpitations or swelling in the hands.  Musculoskeletal: Denies decrease in range of motion, difficulty with gait, muscle pain or joint pain and swelling.  Skin: Denies redness, rashes, lesions or ulcercations.    No other specific complaints in a complete review of systems (except as listed in HPI above).  Objective:   Physical Exam   BP 116/80   Pulse 65   Temp 97.9 F (36.6 C) (Oral)   Wt 189 lb 8 oz (86 kg)   SpO2 97%   BMI 31.53 kg/m  Wt Readings from Last 3 Encounters:  08/01/17 189 lb 8 oz (86  kg)  05/24/17 183 lb 4 oz (83.1 kg)  02/07/17 183 lb (83 kg)    General: Appears her stated age, in NAD. Skin: Warm, dry and intact. No redness, warmth or ulcerations noted. Cardiovascular: Normal rate and rhythm. S1,S2 noted.  No murmur, rubs or gallops noted. Trace edema of RLE, toes to top of shin. Pedal pulses 2+ bilaterally. Pulmonary/Chest: Normal effort and positive vesicular breath sounds. No respiratory distress. No wheezes, rales or ronchi noted.  Musculoskeletal:  No difficulty with gait.    BMET    Component Value Date/Time   NA 143 09/20/2016 1521   K 3.3 (L) 09/20/2016 1521   CL 106 09/20/2016 1521   CO2 30 09/20/2016 1521   GLUCOSE 79 09/20/2016 1521   BUN 15 09/20/2016 1521   CREATININE 0.94 09/20/2016 1521   CALCIUM 9.4 09/20/2016 1521    Lipid Panel     Component Value  Date/Time   CHOL 207 (H) 09/20/2016 1521   TRIG 135.0 09/20/2016 1521   HDL 60.00 09/20/2016 1521   CHOLHDL 3 09/20/2016 1521   VLDL 27.0 09/20/2016 1521   LDLCALC 120 (H) 09/20/2016 1521    CBC    Component Value Date/Time   WBC 8.7 09/20/2016 1521   RBC 4.68 09/20/2016 1521   HGB 13.7 09/20/2016 1521   HCT 41.1 09/20/2016 1521   PLT 280.0 09/20/2016 1521   MCV 87.8 09/20/2016 1521   MCHC 33.3 09/20/2016 1521   RDW 13.0 09/20/2016 1521    Hgb A1C No results found for: HGBA1C         Assessment & Plan:   Edema, RLE:  Mild, ? CVI Encouraged elevation and low salt diet If persist or worsens, consider venous doppler RLE and D dimer Creatinine normal eRx for HCTZ 12.5 mg daily prn Will check BMET at her next visit  Return precautions discussed [name], NP

## 2017-08-01 NOTE — Addendum Note (Signed)
Addended by: Pilar Grammes on: 08/01/2017 11:15 AM   Modules accepted: Orders

## 2017-08-03 ENCOUNTER — Ambulatory Visit
Admission: RE | Admit: 2017-08-03 | Discharge: 2017-08-03 | Disposition: A | Discharge status: Home / Self Care | Payer: BC Managed Care – PPO | Source: Ambulatory Visit | Attending: Internal Medicine | Admitting: Internal Medicine

## 2017-08-03 ENCOUNTER — Other Ambulatory Visit: Payer: Self-pay | Admitting: Internal Medicine

## 2017-08-03 DIAGNOSIS — N6001 Solitary cyst of right breast: Secondary | ICD-10-CM

## 2017-08-03 DIAGNOSIS — N6489 Other specified disorders of breast: Secondary | ICD-10-CM

## 2017-08-30 ENCOUNTER — Other Ambulatory Visit: Payer: Self-pay | Admitting: General Surgery

## 2017-08-30 DIAGNOSIS — R928 Other abnormal and inconclusive findings on diagnostic imaging of breast: Secondary | ICD-10-CM

## 2017-09-13 ENCOUNTER — Other Ambulatory Visit: Payer: Self-pay | Admitting: General Surgery

## 2017-09-13 DIAGNOSIS — R928 Other abnormal and inconclusive findings on diagnostic imaging of breast: Secondary | ICD-10-CM

## 2017-10-08 NOTE — H&P (Signed)
Megan Mcdonald Location: [facility] Patient #: [id number] DOB: March 13, 1961 Married / Language: English / Race: White Female        History of Present Illness       The patient is a 56 year old female who presents with a breast mass. This is a 56 year old female, referred by Dr. Derrel Mcdonald at the [facility] for evaluation of complex sclerosing lesion left breast, lower outer quadrant. Megan Mcdonald, Megan Mcdonald at [facility] is her primary care provider.      The patient has no prior history of breast problems. She had not had a mammogram for a few years but went for screening mammograms. In the right breast they saw two adjacent oval masses, both subcentimeter. They were needle aspirated and shown to be benign cyst. In the left breast there was area of suspicious distortion in the lower outer quadrant. Ultrasound of the left axilla was negative. Image guided stereotactic biopsy of the left breast lower outer quadrant showed complex sclerosing lesion. She was told that excision was recommended and that risk of cancer was as high as 20%. She would like to have this area excised       Past history is minimal. She takes hydrochlorothiazide for ankle edema. She has genital herpes. No other medical problems. Family history she says she doesn't know anything about her family that she does not interact with him and does not know if there is any breast or ovarian cancer. She says that she is married and lives in [place]. Has 2 children. She is a Pharmacist, hospital at [employer]. Denies tobacco. Takes alcohol rarely.     I told her that this was probably a benign finding. Surgical literature suggests 4-9% risk of cancer associated with this. Excision is recommended. She would like to do this. She'll be scheduled for left breast lumpectomy with radioactive seed localization. I discussed the indications, details, techniques, and numerous risk of the surgery with her. She is aware of the risk  of bleeding, infection, cosmetic deformity, nerve damage with chronic pain, reoperation if cancer, and other unforeseen problems. She understands these issues. All of her questions were answered. She agrees with this plan.     Past Surgical History  Breast Biopsy  Left. Cesarean Section - Multiple  Hysterectomy (not due to cancer) - Partial   Diagnostic Studies History  Colonoscopy  never Mammogram  within last year Pap Smear  1-5 years ago  Allergies  No Known Drug Allergies  Allergies Reconciled   Medication History  Estradiol (0.1MG /24HR Patch TW, Transdermal) Active. ValACYclovir HCl (500MG  Tablet, Oral) Active. HydroCHLOROthiazide (25MG  Tablet, Oral) Active. Naproxen (250MG  Tablet, Oral) Active. Medications Reconciled  Social History  Alcohol use  Moderate alcohol use. Caffeine use  Tea. No drug use  Tobacco use  Never smoker.  Family History  Alcohol Abuse  Mother. Heart Disease  Father.  Pregnancy / Birth History  Age at menarche  59 years. Age of menopause  41-50 Contraceptive History  Oral contraceptives. Gravida  3 Length (months) of breastfeeding  3-6 Maternal age  17-25 Para  2 Regular periods   Other Problems  Anxiety Disorder  Back Pain  Lump In Breast     Review of Systems  General Present- Fatigue and Weight Gain. Not Present- Appetite Loss, Chills, Fever, Night Sweats and Weight Loss. Skin Not Present- Change in Wart/Mole, Dryness, Hives, Jaundice, New Lesions, Non-Healing Wounds, Rash and Ulcer. HEENT Present- Earache, Seasonal Allergies and Wears glasses/contact lenses. Not Present-  Hearing Loss, Hoarseness, Nose Bleed, Oral Ulcers, Ringing in the Ears, Sinus Pain, Sore Throat, Visual Disturbances and Yellow Eyes. Respiratory Not Present- Bloody sputum, Chronic Cough, Difficulty Breathing, Snoring and Wheezing. Breast Present- Breast Mass and Breast Pain. Not Present- Nipple Discharge and Skin  Changes. Cardiovascular Present- Leg Cramps and Swelling of Extremities. Not Present- Chest Pain, Difficulty Breathing Lying Down, Palpitations, Rapid Heart Rate and Shortness of Breath. Gastrointestinal Not Present- Abdominal Pain, Bloating, Bloody Stool, Change in Bowel Habits, Chronic diarrhea, Constipation, Difficulty Swallowing, Excessive gas, Gets full quickly at meals, Hemorrhoids, Indigestion, Nausea, Rectal Pain and Vomiting. Female Genitourinary Not Present- Frequency, Nocturia, Painful Urination, Pelvic Pain and Urgency. Musculoskeletal Present- Back Pain and Joint Stiffness. Not Present- Joint Pain, Muscle Pain, Muscle Weakness and Swelling of Extremities. Neurological Present- Headaches. Not Present- Decreased Memory, Fainting, Numbness, Seizures, Tingling, Tremor, Trouble walking and Weakness. Psychiatric Present- Anxiety and Change in Sleep Pattern. Not Present- Bipolar, Depression, Fearful and Frequent crying. Endocrine Present- Hair Changes and Hot flashes. Not Present- Cold Intolerance, Excessive Hunger, Heat Intolerance and New Diabetes. Hematology Not Present- Blood Thinners, Easy Bruising, Excessive bleeding, Gland problems, HIV and Persistent Infections.  Vitals Weight: 184.2 lb Height: 64in Body Surface Area: 1.89 m Body Mass Index: 31.62 kg/m  Temp.: 98.62F  Pulse: 98 (Regular)  BP: 135/70 (Sitting, Left Arm, Standard)    Physical Exam General Mental Status-Alert. General Appearance-Consistent with stated age. Hydration-Well hydrated. Voice-Normal.  Head and Neck Head-normocephalic, atraumatic with no lesions or palpable masses. Trachea-midline. Thyroid Gland Characteristics - normal size and consistency.  Eye Eyeball - Bilateral-Extraocular movements intact. Sclera/Conjunctiva - Bilateral-No scleral icterus.  Chest and Lung Exam Chest and lung exam reveals -quiet, even and easy respiratory effort with no use of accessory  muscles and on auscultation, normal breath sounds, no adventitious sounds and normal vocal resonance. Inspection Chest Wall - Normal. Back - normal.  Breast Note: Breasts are fairly large. Biopsy site lateral left breast. No palpable mass. No hematoma. No ecchymoses. No other skin changes on either side. No axillary adenopathy on either side.   Cardiovascular Cardiovascular examination reveals -normal heart sounds, regular rate and rhythm with no murmurs and normal pedal pulses bilaterally.  Abdomen Inspection Inspection of the abdomen reveals - No Hernias. Skin - Scar - no surgical scars. Palpation/Percussion Palpation and Percussion of the abdomen reveal - Soft, Non Tender, No Rebound tenderness, No Rigidity (guarding) and No hepatosplenomegaly. Auscultation Auscultation of the abdomen reveals - Bowel sounds normal.  Neurologic Neurologic evaluation reveals -alert and oriented x 3 with no impairment of recent or remote memory. Mental Status-Normal.  Musculoskeletal Normal Exam - Left-Upper Extremity Strength Normal and Lower Extremity Strength Normal. Normal Exam - Right-Upper Extremity Strength Normal and Lower Extremity Strength Normal.  Lymphatic Head & Neck  General Head & Neck Lymphatics: Bilateral - Description - Normal. Axillary  General Axillary Region: Bilateral - Description - Normal. Tenderness - Non Tender. Femoral & Inguinal  Generalized Femoral & Inguinal Lymphatics: Bilateral - Description - Normal. Tenderness - Non Tender.    Assessment & Plan  ABNORMAL MAMMOGRAM OF LEFT BREAST (R92.8)   Your recent imaging studies and biopsy show that you had benign cysts in the right breast. In the left breast tissue had an abnormal finding called a complex sclerosing lesion. This is probably benign.. There is, however, a 5-10% chance that there could be cancer there right now Excision of this area is recommended and you  stated that you would like to do  this.  you will be scheduled for left breast lumpectomy with radioactive seed localization We have discussed the indications, techniques, and risks of the surgery in detail    [facility]. Dalbert Batman, M.D., [facility] General and Minimally invasive Surgery Breast and Colorectal Surgery Office:   [phone] Pager:   [phone]

## 2017-10-09 ENCOUNTER — Ambulatory Visit: Payer: Self-pay | Admitting: *Deleted

## 2017-10-09 ENCOUNTER — Ambulatory Visit: Payer: BC Managed Care – PPO | Admitting: Family Medicine

## 2017-10-09 ENCOUNTER — Encounter (HOSPITAL_BASED_OUTPATIENT_CLINIC_OR_DEPARTMENT_OTHER): Payer: Self-pay | Admitting: *Deleted

## 2017-10-09 NOTE — Telephone Encounter (Signed)
Left voice message on patient's cell phone that we were able to secure her an appt today at 1145 with Dr Damita Dunnings  at [facility]. Will attempt to contact pt again at 1030

## 2017-10-09 NOTE — Telephone Encounter (Signed)
   Reason for Disposition . [1] MODERATE dizziness (e.g., vertigo; feels very unsteady, interferes with normal activities) AND [2] has NOT been evaluated by physician for this  Answer Assessment - Initial Assessment Questions 1. DESCRIPTION: "Describe your dizziness."     "I feel drunk" 2. LIGHTHEADED: "Do you feel lightheaded?"     yes 3. VERTIGO: "Do you feel like either you or the room is spinning or tilting?" (i.e. vertigo)     "Yes feels like room is spinning when I stand up" 4. SEVERITY: "How bad is it?"  "Do you feel like you are going to faint?" "Can you stand and walk?"   - MILD - walking normally   - 5. ONSET:  "When did the dizziness begin?"     This am about 5:00 6. AGGRAVATING FACTORS: "Does anything make it worse?" (e.g., standing, change in head position)   No 7. HEART RATE: "Can you tell me your heart rate?" "How many beats in 15 seconds?"  (Note: not all patients can do this)   Pt unable to take pulse 8. CAUSE: "What do you think is causing the dizziness?"   No 9. RECURRENT SYMPTOM: "Have you had dizziness before?" If so, ask: "When was the last time?" "What happened that time?"     No 10. OTHER SYMPTOMS: "Do you have any other symptoms?" (e.g., fever, chest pain, vomiting, diarrhea, bleeding)       Nausea when woke up but able to eat an apple 11. PREGNANCY: "Is there any chance you are pregnant?" "When was your last menstrual period?"      No, hysterectomy 1991  Protocols used: DIZZINESS - [place]  Pt scheduled to see Clarene Reamer, FNP at 1145 today

## 2017-10-09 NOTE — Telephone Encounter (Signed)
Contacted patient via cell phone at approximately 0910 to alert her that I scheduled her appt for the wrong date.  She was understanding.  Flow coordinator at [facility] notified; Awaiting return message

## 2017-10-09 NOTE — Telephone Encounter (Signed)
Contacted by Mearl Latin at [facility] stating Appt request for 1145 no longer available; Pt did not return call to verify if . Left message on pt's voice mail to contact office to schedule appointment.

## 2017-10-11 ENCOUNTER — Encounter: Payer: Self-pay | Admitting: Family Medicine

## 2017-10-11 ENCOUNTER — Ambulatory Visit
Admission: RE | Admit: 2017-10-11 | Discharge: 2017-10-11 | Disposition: A | Discharge status: Home / Self Care | Payer: BC Managed Care – PPO | Source: Ambulatory Visit | Attending: General Surgery | Admitting: General Surgery

## 2017-10-11 ENCOUNTER — Ambulatory Visit (INDEPENDENT_AMBULATORY_CARE_PROVIDER_SITE_OTHER): Payer: BC Managed Care – PPO | Admitting: Family Medicine

## 2017-10-11 VITALS — BP 136/86 | HR 73 | Temp 97.7°F | Wt 184.2 lb

## 2017-10-11 DIAGNOSIS — H6992 Unspecified Eustachian tube disorder, left ear: Secondary | ICD-10-CM

## 2017-10-11 DIAGNOSIS — R51 Headache: Secondary | ICD-10-CM

## 2017-10-11 DIAGNOSIS — R519 Headache, unspecified: Secondary | ICD-10-CM

## 2017-10-11 DIAGNOSIS — R928 Other abnormal and inconclusive findings on diagnostic imaging of breast: Secondary | ICD-10-CM

## 2017-10-11 NOTE — Progress Notes (Signed)
Subjective:    Patient ID: Megan Mcdonald, female    DOB: 08-18-1961, 56 y.o.   MRN: [id number]  HPI This is a 56 yo female who presents today with left ear pain x 2 months. Three days ago she awoke with spinning. Pain is from ear down into throat. Had left ear infection 2/18 (red, bulging TM)- was treated with flonase, amoxicil. Could not use flonase, does not like to put anything in her nose. Does not feel that ear infection completely resolved. Was seen 6/18 for viral URI, ear exam at that time was normal. No fever or cough, mild sore throat, seasonal allergies, takes occasional Zyrtec. Doesn't like to take Sudafed, makes her feel strange.   Has had daily headaches for years. Per patient, negative head CT. Takes Alleve, 2 tablets most days.   She is having left breast lumpectomy 10/13/17. She states she is sleeping well. She is a [employer].   Past Medical History:  Diagnosis Date  . Anxiety   . Depression   . Headache   . Left breast mass    Past Surgical History:  Procedure Laterality Date  . ABDOMINAL HYSTERECTOMY  1990   Partial  . TUBAL LIGATION     Family History  Problem Relation Age of Onset  . Alcohol abuse Mother   . Heart disease Father   . Cancer Neg Hx   . Diabetes Neg Hx   . Stroke Neg Hx    Social History  Substance Use Topics  . Smoking status: Never Smoker  . Smokeless tobacco: Never Used  . Alcohol use No      Review of Systems Per HPI    Objective:   Physical Exam  Constitutional: She is oriented to person, place, and time. She appears well-developed and well-nourished. No distress.  HENT:  Head: Normocephalic and atraumatic.  Right Ear: Tympanic membrane, external ear and ear canal normal.  Left Ear: External ear and ear canal normal.  Nose: Nose normal.  Mouth/Throat: Oropharynx is clear and moist.  Small area scarring. Dull TM, no erythema, no pain with exam.   Eyes: Conjunctivae are normal.  Neck: Normal range of motion. Neck  supple.  Cardiovascular: Normal rate, regular rhythm and normal heart sounds.   Pulmonary/Chest: Effort normal and breath sounds normal.  Lymphadenopathy:    She has no cervical adenopathy.  Neurological: She is alert and oriented to person, place, and time.  Skin: Skin is warm and dry. She is not diaphoretic.  Psychiatric: She has a normal mood and affect. Her behavior is normal. Judgment and thought content normal.  Vitals reviewed.     BP 136/86 (BP Location: Right Arm, Patient Position: Sitting, Cuff Size: Normal)   Pulse 73   Temp 97.7 F (36.5 C) (Oral)   Wt 184 lb 4 oz (83.6 kg)   SpO2 97%   BMI 29.74 kg/m  Wt Readings from Last 3 Encounters:  10/11/17 184 lb 4 oz (83.6 kg)  08/01/17 189 lb 8 oz (86 kg)  05/24/17 183 lb 4 oz (83.1 kg)       Assessment & Plan:  1. Eustachian tube disorder, left - Provided written and verbal information regarding diagnosis and treatment. - discussed use of fluticasone, decongestants, antihistamines - if no improvement with symptomatic measures, can refer to ENT  2. Chronic daily headache - offered her a referral to headache clinic, she stated she would not have time to go until next spring. I provided her with the  number so she can make an appointment when her schedule allows.   Clarene Reamer, FNP-BC  [facility]  10/11/2017 10:37 AM

## 2017-10-11 NOTE — Progress Notes (Signed)
Pt given Ensure to drink by 0400 day of surgery with teach back method.

## 2017-10-11 NOTE — Patient Instructions (Signed)
[facility]  [phone]   Barotitis Media Barotitis media is inflammation of the middle ear. This condition occurs when an auditory tube (eustachian tube) is blocked in one or both ears. These tubes lead from the middle ear to the back of the nose (nasopharynx). This condition typically occurs when you experience changes in pressure, such as when flying or scuba diving. Untreated barotitis media may lead to damage or hearing loss (barotrauma), which may become permanent. What are the causes? This condition may be caused by changes in air pressure from:  Flying.  Scuba diving.  A nearby explosion.  What increases the risk? The following factors may make you more likely to develop this condition:  Middle ear infection.  Sinus infection.  A cold.  Environmental allergies.  Small eustachian tubes.  Recent ear surgery.  What are the signs or symptoms? Symptoms of this condition may include:  Ear pain.  Hearing loss.  In severe cases, symptoms can include:  Dizziness and nausea (vertigo).  Temporary facial paralysis.  How is this diagnosed? This condition is diagnosed based on:  A physical exam. Your health care provider may: ? Use a device (otoscope) to look into your ear canal and check your eardrum. ? Do a test that changes air pressure in the middle ear to check how well the eardrum moves and to see if the eustachian tube is working(tympanogram).  Your medical history.  In some cases, your health care provider may have you take a hearing test. You may also be referred to someone who specializes in ear treatment (otolaryngologist, "ENT"). How is this treated? This condition may be treated with:  Medicines to relieve congestion in your nose, sinus, or upper respiratory tract (decongestants).  Techniques to equalize pressure (to "pop" your ears), such as: ? Yawning. ? Chewing gum. ? Swallowing.  In severe cases, you may need surgery to  relieve your symptoms or to prevent future inflammation. Follow these instructions at home:  Take over-the-counter and prescription medicines only as told by your health care provider.  Do not put anything into your ears to clean or unplug them. Ear drops will not help.  Keep all follow-up visits as told by your health care provider. This is important. How is this prevented? Using these strategies may help to prevent barotitis media:  Chewing gum with frequent, forceful swallowing during takeoff and landing when flying.  Holding your nose and gently blowing to pop your ears for equalizing pressure changes. This forces air into the eustachian tube.  Yawning during air pressure changes.  Using a nasal decongestant about 30-60 minutes before flying, if you have nasal congestion.  Contact a health care provider if:  You have vertigo.  You have hearing loss.  Your symptoms do not get better or they get worse.  You have a fever. Get help right away if:  You have a severe headache, ear pain, and dizziness.  You have balance problems.  You cannot move or feel part of your face.  You have bloody or pus-like drainage from your ears. Summary  Barotitis media is inflammation of the middle ear.  This condition typically occurs when you experience changes in pressure, such as when flying or scuba diving.  You may be at a higher risk for this condition if you have small eustachian tubes, had recent ear surgery, or have allergies, a cold, or sinus or middle ear infection.  This condition may be treated with medicines or techniques to equalize pressure in  your ears.  Strategies can be used to help prevent barotitis media. This information is not intended to replace advice given to you by your health care provider. Make sure you discuss any questions you have with your health care provider. Document Released: 11/25/2000 Document Revised: 10/17/2016 Document Reviewed:  10/17/2016 Elsevier Interactive Patient Education  2017 [employer].

## 2017-10-13 ENCOUNTER — Ambulatory Visit
Admission: RE | Admit: 2017-10-13 | Discharge: 2017-10-13 | Disposition: A | Discharge status: Home / Self Care | Payer: BC Managed Care – PPO | Source: Ambulatory Visit | Attending: General Surgery | Admitting: General Surgery

## 2017-10-13 ENCOUNTER — Encounter (HOSPITAL_BASED_OUTPATIENT_CLINIC_OR_DEPARTMENT_OTHER)
Admission: RE | Disposition: A | Discharge status: Home / Self Care | Payer: Self-pay | Source: Ambulatory Visit | Attending: General Surgery

## 2017-10-13 ENCOUNTER — Ambulatory Visit (HOSPITAL_BASED_OUTPATIENT_CLINIC_OR_DEPARTMENT_OTHER): Payer: BC Managed Care – PPO | Admitting: Anesthesiology

## 2017-10-13 ENCOUNTER — Encounter (HOSPITAL_BASED_OUTPATIENT_CLINIC_OR_DEPARTMENT_OTHER): Payer: Self-pay | Admitting: *Deleted

## 2017-10-13 ENCOUNTER — Ambulatory Visit (HOSPITAL_BASED_OUTPATIENT_CLINIC_OR_DEPARTMENT_OTHER)
Admission: RE | Admit: 2017-10-13 | Discharge: 2017-10-13 | Disposition: A | Discharge status: Home / Self Care | Payer: BC Managed Care – PPO | Source: Ambulatory Visit | Attending: General Surgery | Admitting: General Surgery

## 2017-10-13 DIAGNOSIS — D242 Benign neoplasm of left breast: Secondary | ICD-10-CM | POA: Insufficient documentation

## 2017-10-13 DIAGNOSIS — R928 Other abnormal and inconclusive findings on diagnostic imaging of breast: Secondary | ICD-10-CM

## 2017-10-13 DIAGNOSIS — N6092 Unspecified benign mammary dysplasia of left breast: Secondary | ICD-10-CM | POA: Diagnosis not present

## 2017-10-13 DIAGNOSIS — Z90710 Acquired absence of both cervix and uterus: Secondary | ICD-10-CM | POA: Insufficient documentation

## 2017-10-13 DIAGNOSIS — Z79899 Other long term (current) drug therapy: Secondary | ICD-10-CM | POA: Insufficient documentation

## 2017-10-13 DIAGNOSIS — F419 Anxiety disorder, unspecified: Secondary | ICD-10-CM | POA: Insufficient documentation

## 2017-10-13 HISTORY — DX: Headache, unspecified: R51.9

## 2017-10-13 HISTORY — PX: BREAST LUMPECTOMY WITH RADIOACTIVE SEED LOCALIZATION: SHX6424

## 2017-10-13 HISTORY — DX: Headache: R51

## 2017-10-13 HISTORY — DX: Depression, unspecified: F32.A

## 2017-10-13 HISTORY — DX: Other abnormal and inconclusive findings on diagnostic imaging of breast: R92.8

## 2017-10-13 HISTORY — DX: Major depressive disorder, single episode, unspecified: F32.9

## 2017-10-13 HISTORY — DX: Anxiety disorder, unspecified: F41.9

## 2017-10-13 HISTORY — DX: Unspecified lump in the left breast, unspecified quadrant: N63.20

## 2017-10-13 SURGERY — BREAST LUMPECTOMY WITH RADIOACTIVE SEED LOCALIZATION
Anesthesia: General | Site: Breast | Laterality: Left

## 2017-10-13 MED ORDER — ONDANSETRON HCL 4 MG/2ML IJ SOLN
INTRAMUSCULAR | Status: AC
  Filled 2017-10-13: qty 2

## 2017-10-13 MED ORDER — CHLORHEXIDINE GLUCONATE CLOTH 2 % EX PADS: 6.0000 | MEDICATED_PAD | Freq: Once | CUTANEOUS | Status: DC

## 2017-10-13 MED ORDER — CEFAZOLIN SODIUM-DEXTROSE 2-4 GM/100ML-% IV SOLN
INTRAVENOUS | Status: AC
  Filled 2017-10-13: qty 100

## 2017-10-13 MED ORDER — MIDAZOLAM HCL 2 MG/2ML IJ SOLN
1.0000 mg | INTRAMUSCULAR | Status: DC | PRN
  Administered 2017-10-13: 2 mg via INTRAVENOUS

## 2017-10-13 MED ORDER — PROPOFOL 500 MG/50ML IV EMUL
INTRAVENOUS | Status: AC
  Filled 2017-10-13: qty 50

## 2017-10-13 MED ORDER — OXYCODONE HCL 5 MG/5ML PO SOLN: 5.0000 mg | Freq: Once | ORAL | Status: AC | PRN

## 2017-10-13 MED ORDER — CELECOXIB 200 MG PO CAPS
200.0000 mg | ORAL_CAPSULE | ORAL | Status: AC
  Administered 2017-10-13: 200 mg via ORAL

## 2017-10-13 MED ORDER — MIDAZOLAM HCL 2 MG/2ML IJ SOLN
INTRAMUSCULAR | Status: AC
  Filled 2017-10-13: qty 2

## 2017-10-13 MED ORDER — OXYCODONE HCL 5 MG PO TABS
ORAL_TABLET | ORAL | Status: AC
  Filled 2017-10-13: qty 1

## 2017-10-13 MED ORDER — HEPARIN (PORCINE) IN NACL 2-0.9 UNIT/ML-% IJ SOLN
INTRAMUSCULAR | Status: AC
  Filled 2017-10-13: qty 500

## 2017-10-13 MED ORDER — FENTANYL CITRATE (PF) 100 MCG/2ML IJ SOLN
25.0000 ug | INTRAMUSCULAR | Status: DC | PRN
  Administered 2017-10-13: 50 ug via INTRAVENOUS

## 2017-10-13 MED ORDER — SCOPOLAMINE 1 MG/3DAYS TD PT72: 1.0000 | MEDICATED_PATCH | Freq: Once | TRANSDERMAL | Status: DC | PRN

## 2017-10-13 MED ORDER — FENTANYL CITRATE (PF) 100 MCG/2ML IJ SOLN
50.0000 ug | INTRAMUSCULAR | Status: AC | PRN
  Administered 2017-10-13 (×3): 50 ug via INTRAVENOUS

## 2017-10-13 MED ORDER — LACTATED RINGERS IV SOLN
INTRAVENOUS | Status: DC
  Administered 2017-10-13: 07:00:00 via INTRAVENOUS

## 2017-10-13 MED ORDER — OXYCODONE HCL 5 MG PO TABS
5.0000 mg | ORAL_TABLET | Freq: Once | ORAL | Status: AC | PRN
  Administered 2017-10-13: 5 mg via ORAL

## 2017-10-13 MED ORDER — LIDOCAINE 2% (20 MG/ML) 5 ML SYRINGE
INTRAMUSCULAR | Status: AC
  Filled 2017-10-13: qty 5

## 2017-10-13 MED ORDER — ONDANSETRON HCL 4 MG/2ML IJ SOLN
INTRAMUSCULAR | Status: DC | PRN
  Administered 2017-10-13: 4 mg via INTRAVENOUS

## 2017-10-13 MED ORDER — KETOROLAC TROMETHAMINE 30 MG/ML IJ SOLN
INTRAMUSCULAR | Status: AC
  Filled 2017-10-13: qty 1

## 2017-10-13 MED ORDER — LIDOCAINE 2% (20 MG/ML) 5 ML SYRINGE
INTRAMUSCULAR | Status: DC | PRN
  Administered 2017-10-13: 40 mg via INTRAVENOUS

## 2017-10-13 MED ORDER — DEXAMETHASONE SODIUM PHOSPHATE 10 MG/ML IJ SOLN
INTRAMUSCULAR | Status: AC
  Filled 2017-10-13: qty 1

## 2017-10-13 MED ORDER — ACETAMINOPHEN 500 MG PO TABS
1000.0000 mg | ORAL_TABLET | ORAL | Status: AC
  Administered 2017-10-13: 1000 mg via ORAL

## 2017-10-13 MED ORDER — DEXAMETHASONE SODIUM PHOSPHATE 4 MG/ML IJ SOLN
INTRAMUSCULAR | Status: DC | PRN
  Administered 2017-10-13: 10 mg via INTRAVENOUS

## 2017-10-13 MED ORDER — HYDROCODONE-ACETAMINOPHEN 5-325 MG PO TABS: 1.0000 | ORAL_TABLET | Freq: Four times a day (QID) | ORAL | 0 refills | Status: DC | PRN

## 2017-10-13 MED ORDER — BUPIVACAINE-EPINEPHRINE (PF) 0.5% -1:200000 IJ SOLN
INTRAMUSCULAR | Status: AC
  Filled 2017-10-13: qty 120

## 2017-10-13 MED ORDER — CEFAZOLIN SODIUM-DEXTROSE 2-4 GM/100ML-% IV SOLN
2.0000 g | INTRAVENOUS | Status: AC
  Administered 2017-10-13: 2 g via INTRAVENOUS

## 2017-10-13 MED ORDER — PROPOFOL 10 MG/ML IV BOLUS
INTRAVENOUS | Status: DC | PRN
  Administered 2017-10-13: 160 mg via INTRAVENOUS

## 2017-10-13 MED ORDER — CELECOXIB 200 MG PO CAPS
ORAL_CAPSULE | ORAL | Status: AC
  Filled 2017-10-13: qty 1

## 2017-10-13 MED ORDER — FENTANYL CITRATE (PF) 100 MCG/2ML IJ SOLN
INTRAMUSCULAR | Status: AC
  Filled 2017-10-13: qty 2

## 2017-10-13 MED ORDER — KETOROLAC TROMETHAMINE 30 MG/ML IJ SOLN
INTRAMUSCULAR | Status: DC | PRN
  Administered 2017-10-13: 30 mg via INTRAVENOUS

## 2017-10-13 MED ORDER — ONDANSETRON HCL 4 MG/2ML IJ SOLN: 4.0000 mg | Freq: Four times a day (QID) | INTRAMUSCULAR | Status: DC | PRN

## 2017-10-13 MED ORDER — BUPIVACAINE-EPINEPHRINE (PF) 0.5% -1:200000 IJ SOLN
INTRAMUSCULAR | Status: DC | PRN
  Administered 2017-10-13: 10 mL

## 2017-10-13 MED ORDER — ACETAMINOPHEN 500 MG PO TABS
ORAL_TABLET | ORAL | Status: AC
  Filled 2017-10-13: qty 2

## 2017-10-13 MED ORDER — HEPARIN SOD (PORK) LOCK FLUSH 100 UNIT/ML IV SOLN
INTRAVENOUS | Status: AC
  Filled 2017-10-13: qty 5

## 2017-10-13 SURGICAL SUPPLY — 62 items
APPLIER CLIP 9.375 MED OPEN (MISCELLANEOUS) ×3
BENZOIN TINCTURE PRP APPL 2/3 (GAUZE/BANDAGES/DRESSINGS) IMPLANT
BINDER BREAST LRG (GAUZE/BANDAGES/DRESSINGS) IMPLANT
BINDER BREAST MEDIUM (GAUZE/BANDAGES/DRESSINGS) IMPLANT
BINDER BREAST XLRG (GAUZE/BANDAGES/DRESSINGS) ×3 IMPLANT
BINDER BREAST XXLRG (GAUZE/BANDAGES/DRESSINGS) IMPLANT
BLADE HEX COATED 2.75 (ELECTRODE) ×3 IMPLANT
BLADE SURG 10 STRL SS (BLADE) IMPLANT
BLADE SURG 15 STRL LF DISP TIS (BLADE) ×1 IMPLANT
BLADE SURG 15 STRL SS (BLADE) ×2
CANISTER SUC SOCK COL 7IN (MISCELLANEOUS) IMPLANT
CANISTER SUCT 1200ML W/VALVE (MISCELLANEOUS) ×3 IMPLANT
CHLORAPREP W/TINT 26ML (MISCELLANEOUS) ×3 IMPLANT
CLIP APPLIE 9.375 MED OPEN (MISCELLANEOUS) ×1 IMPLANT
CLOSURE WOUND 1/2 X4 (GAUZE/BANDAGES/DRESSINGS)
COVER BACK TABLE 60X90IN (DRAPES) ×3 IMPLANT
COVER MAYO STAND STRL (DRAPES) ×3 IMPLANT
COVER PROBE W GEL 5X96 (DRAPES) ×3 IMPLANT
DECANTER SPIKE VIAL GLASS SM (MISCELLANEOUS) IMPLANT
DERMABOND ADVANCED (GAUZE/BANDAGES/DRESSINGS) ×2
DERMABOND ADVANCED .7 DNX12 (GAUZE/BANDAGES/DRESSINGS) ×1 IMPLANT
DEVICE DUBIN W/COMP PLATE 8390 (MISCELLANEOUS) ×3 IMPLANT
DRAPE LAPAROSCOPIC ABDOMINAL (DRAPES) ×3 IMPLANT
DRAPE UTILITY XL STRL (DRAPES) ×3 IMPLANT
DRSG PAD ABDOMINAL 8X10 ST (GAUZE/BANDAGES/DRESSINGS) IMPLANT
ELECT REM PT RETURN 9FT ADLT (ELECTROSURGICAL) ×3
ELECTRODE REM PT RTRN 9FT ADLT (ELECTROSURGICAL) ×1 IMPLANT
GAUZE SPONGE 4X4 12PLY STRL LF (GAUZE/BANDAGES/DRESSINGS) IMPLANT
GLOVE BIO SURGEON STRL SZ 6.5 (GLOVE) ×2 IMPLANT
GLOVE BIO SURGEONS STRL SZ 6.5 (GLOVE) ×1
GLOVE EUDERMIC 7 POWDERFREE (GLOVE) ×3 IMPLANT
GLOVE EXAM NITRILE MD LF STRL (GLOVE) ×3 IMPLANT
GOWN STRL REUS W/ TWL LRG LVL3 (GOWN DISPOSABLE) ×1 IMPLANT
GOWN STRL REUS W/ TWL XL LVL3 (GOWN DISPOSABLE) ×1 IMPLANT
GOWN STRL REUS W/TWL LRG LVL3 (GOWN DISPOSABLE) ×2
GOWN STRL REUS W/TWL XL LVL3 (GOWN DISPOSABLE) ×2
ILLUMINATOR WAVEGUIDE N/F (MISCELLANEOUS) IMPLANT
KIT MARKER MARGIN INK (KITS) ×3 IMPLANT
LIGHT WAVEGUIDE WIDE FLAT (MISCELLANEOUS) IMPLANT
NEEDLE HYPO 25X1 1.5 SAFETY (NEEDLE) ×3 IMPLANT
NS IRRIG 1000ML POUR BTL (IV SOLUTION) ×3 IMPLANT
PACK BASIN DAY SURGERY FS (CUSTOM PROCEDURE TRAY) ×3 IMPLANT
PENCIL BUTTON HOLSTER BLD 10FT (ELECTRODE) ×3 IMPLANT
SHEET MEDIUM DRAPE 40X70 STRL (DRAPES) IMPLANT
SLEEVE SCD COMPRESS KNEE MED (MISCELLANEOUS) ×3 IMPLANT
SPONGE LAP 18X18 X RAY DECT (DISPOSABLE) IMPLANT
SPONGE LAP 4X18 X RAY DECT (DISPOSABLE) ×3 IMPLANT
STRIP CLOSURE SKIN 1/2X4 (GAUZE/BANDAGES/DRESSINGS) IMPLANT
SUT ETHILON 3 0 FSL (SUTURE) IMPLANT
SUT MNCRL AB 4-0 PS2 18 (SUTURE) ×3 IMPLANT
SUT SILK 2 0 SH (SUTURE) ×3 IMPLANT
SUT VIC AB 2-0 CT1 27 (SUTURE)
SUT VIC AB 2-0 CT1 TAPERPNT 27 (SUTURE) IMPLANT
SUT VIC AB 3-0 SH 27 (SUTURE)
SUT VIC AB 3-0 SH 27X BRD (SUTURE) IMPLANT
SUT VICRYL 3-0 CR8 SH (SUTURE) ×3 IMPLANT
SYR 10ML LL (SYRINGE) ×3 IMPLANT
TOWEL OR 17X24 6PK STRL BLUE (TOWEL DISPOSABLE) ×3 IMPLANT
TOWEL OR NON WOVEN STRL DISP B (DISPOSABLE) IMPLANT
TUBE CONNECTING 20'X1/4 (TUBING) ×1
TUBE CONNECTING 20X1/4 (TUBING) ×2 IMPLANT
YANKAUER SUCT BULB TIP NO VENT (SUCTIONS) ×3 IMPLANT

## 2017-10-13 NOTE — Op Note (Signed)
Patient Name:           Megan Mcdonald   Date of Surgery:        10/13/2017  Pre op Diagnosis:      Complex sclerosing lesion left breast  Post op Diagnosis:    Same  Procedure:                 Left breast lumpectomy with radioactive seed localization  Surgeon:                     Edsel Petrin. Dalbert Batman, M.D., FACS  Assistant:                      OR staff   Indication for Assistant: N/A  Operative Indications:       . This is a 57 year old female, referred by Dr. Derrel Nip at the [facility] for evaluation of complex sclerosing lesion left breast, lower outer quadrant. [name], [place] at [facility] is her primary care provider.      The patient has no prior history of breast problems. She had not had a mammogram for a few years but went for screening mammograms. In the right breast they saw two adjacent oval masses, both subcentimeter. They were needle aspirated and shown to be benign cyst. In the left breast there was area of suspicious distortion in the lower outer quadrant. Ultrasound of the left axilla was negative. Image guided stereotactic biopsy of the left breast lower outer quadrant showed complex sclerosing lesion. She was told by radiology  that excision was recommended and that risk of cancer was as high as 20%. She would like to have this area excised. Family history she says she doesn't know anything about her family that she does not interact with him and does not know if there is any breast or ovarian cancer.     I told her that this was probably a benign finding. Surgical literature suggests 4-9% risk of cancer associated with this. Excision is recommended. She would like to do this.    Operative Findings:       The lumbar 3 specimen showed the radioactive seed and the original titanium marker clip in the center of the specimen.  This was felt to be adequately excised.  Margins were marked  Procedure in Detail:          Following the induction of general LMA anesthesia the  patient's left breast was prepped and draped in a sterile fashion.  Surgical timeout was performed.  Intravenous antibodies were given.  0.5% Marcaine with epinephrine was used as local infiltration anesthetic.     Using the neoprobe I found that the radioactive signal was in the far lateral left breast at about the 3:00 position.  A transverse radially oriented incision was made in this location.  The lumpectomy was performed using electrocautery and the neoprobe.  The specimen was removed and marked with silk sutures and a 6 color ink kit to orient the pathologist.  The specimen mammogram looked good as mentioned above.  Specimen was sent to the lab.  Hemostasis was excellent and achieved electrocautery.  The wound was irrigated.  The walls of the lumpectomy cavity were marked with metallic clips.  The breast tissues were reapproximated in 2 layers with interrupted 3-0 Vicryl and the skin closed with running subcuticular 4-0 Monocryl and Dermabond.  Breast binder was placed the patient taken to PACU in stable condition.  EBL 20 mL  or less.  Complications none.  Counts correct.        Edsel Petrin. Dalbert Batman, M.D., FACS General and Minimally Invasive Surgery Breast and Colorectal Surgery  Addendum: I logged onto the [facility] website and reviewed her prescription medication history.  10/13/2017 8:25 AM

## 2017-10-13 NOTE — Anesthesia Procedure Notes (Signed)
Procedure Name: LMA Insertion Performed by: Journey Castonguay W Pre-anesthesia Checklist: Patient identified, Emergency Drugs available, Suction available and Patient being monitored Patient Re-evaluated:Patient Re-evaluated prior to induction Oxygen Delivery Method: Circle system utilized Preoxygenation: Pre-oxygenation with 100% oxygen Induction Type: IV induction Ventilation: Mask ventilation without difficulty LMA: LMA inserted LMA Size: 4.0 Number of attempts: 1 Placement Confirmation: positive ETCO2 Tube secured with: Tape Dental Injury: Teeth and Oropharynx as per pre-operative assessment        

## 2017-10-13 NOTE — Anesthesia Preprocedure Evaluation (Signed)
Anesthesia Evaluation  Patient identified by MRN, date of birth, ID band Patient awake    Reviewed: Allergy & Precautions, H&P , NPO status , Patient's Chart, lab work & pertinent test results  Airway Mallampati: II   Neck ROM: full    Dental   Pulmonary neg pulmonary ROS,    breath sounds clear to auscultation       Cardiovascular negative cardio ROS   Rhythm:regular Rate:Normal     Neuro/Psych  Headaches, PSYCHIATRIC DISORDERS Anxiety Depression    GI/Hepatic   Endo/Other    Renal/GU      Musculoskeletal   Abdominal   Peds  Hematology   Anesthesia Other Findings   Reproductive/Obstetrics Breast lump                             Anesthesia Physical Anesthesia Plan  ASA: II  Anesthesia Plan: General   Post-op Pain Management:    Induction: Intravenous  PONV Risk Score and Plan: 3 and Ondansetron, Dexamethasone, Midazolam and Treatment may vary due to age or medical condition  Airway Management Planned: LMA  Additional Equipment:   Intra-op Plan:   Post-operative Plan:   Informed Consent: I have reviewed the patients History and Physical, chart, labs and discussed the procedure including the risks, benefits and alternatives for the proposed anesthesia with the patient or authorized representative who has indicated his/her understanding and acceptance.     Plan Discussed with: CRNA, Anesthesiologist and Surgeon  Anesthesia Plan Comments:         Anesthesia Quick Evaluation

## 2017-10-13 NOTE — Anesthesia Postprocedure Evaluation (Signed)
Anesthesia Post Note  Patient: Megan Mcdonald  Procedure(s) Performed: LEFT BREAST LUMPECTOMY WITH RADIOACTIVE SEED LOCALIZATION (Left Breast)     Patient location during evaluation: PACU Anesthesia Type: General Level of consciousness: awake and alert Pain management: pain level controlled Vital Signs Assessment: post-procedure vital signs reviewed and stable Respiratory status: spontaneous breathing, nonlabored ventilation, respiratory function stable and patient connected to nasal cannula oxygen Cardiovascular status: blood pressure returned to baseline and stable Postop Assessment: no apparent nausea or vomiting Anesthetic complications: no    Last Vitals:  Vitals:   10/13/17 0930 10/13/17 1000  BP: 126/73 140/85  Pulse: (!) 58 64  Resp: 10 16  Temp:  36.6 C  SpO2: 97% 97%    Last Pain:  Vitals:   10/13/17 1000  TempSrc:   PainSc: [place]

## 2017-10-13 NOTE — Discharge Instructions (Signed)
[facility] °Office Phone Number [phone] ° °BREAST BIOPSY/ LUMPECTOMY: POST OP INSTRUCTIONS ° °Always review your discharge instruction sheet given to you by the facility where your surgery was performed. ° °IF YOU HAVE DISABILITY OR FAMILY LEAVE FORMS, YOU MUST BRING THEM TO THE OFFICE FOR PROCESSING.  DO NOT GIVE THEM TO YOUR DOCTOR. ° °1. A prescription for pain medication may be given to you upon discharge.  Take your pain medication as prescribed, if needed.  If narcotic pain medicine is not needed, then you may take acetaminophen (Tylenol) or ibuprofen (Advil) as needed. °2. Take your usually prescribed medications unless otherwise directed °3. If you need a refill on your pain medication, please contact your pharmacy.  They will contact our office to request authorization.  Prescriptions will not be filled after 5pm or on week-ends. °4. You should eat very light the first 24 hours after surgery, such as soup, crackers, pudding, etc.  Resume your normal diet the day after surgery. °5. Most patients will experience some swelling and bruising in the breast.  Ice packs and a good support bra will help.  Swelling and bruising can take several days to resolve.  °6. It is common to experience some constipation if taking pain medication after surgery.  Increasing fluid intake and taking a stool softener will usually help or prevent this problem from occurring.  A mild laxative (Milk of Magnesia or Miralax) should be taken according to package directions if there are no bowel movements after 48 hours. °7. Unless discharge instructions indicate otherwise, you may remove your bandages 24-48 hours after surgery, and you may shower at that time.  You may have steri-strips (small skin tapes) in place directly over the incision.  These strips should be left on the skin for 7-10 days.  If your surgeon used skin glue on the incision, you may shower in 24 hours.  The glue will flake off over the next 2-3  weeks.  Any sutures or staples will be removed at the office during your follow-up visit. °8. ACTIVITIES:  You may resume regular daily activities (gradually increasing) beginning the next day.  Wearing a good support bra or sports bra minimizes pain and swelling.  You may have sexual intercourse when it is comfortable. °a. You may drive when you no longer are taking prescription pain medication, you can comfortably wear a seatbelt, and you can safely maneuver your car and apply brakes. °b. RETURN TO WORK:  ______________________________________________________________________________________ °9. You should see your doctor in the office for a follow-up appointment approximately two weeks after your surgery.  Your doctor’s nurse will typically make your follow-up appointment when she calls you with your pathology report.  Expect your pathology report 2-3 business days after your surgery.  You may call to check if you do not hear from us after three days. °10. OTHER INSTRUCTIONS: _______________________________________________________________________________________________ _____________________________________________________________________________________________________________________________________ °_____________________________________________________________________________________________________________________________________ °_____________________________________________________________________________________________________________________________________ ° °WHEN TO CALL YOUR DOCTOR: °1. Fever over 101.0 °2. Nausea and/or vomiting. °3. Extreme swelling or bruising. °4. Continued bleeding from incision. °5. Increased pain, redness, or drainage from the incision. ° °The clinic staff is available to answer your questions during regular business hours.  Please don’t hesitate to call and ask to speak to one of the nurses for clinical concerns.  If you have a medical emergency, go to the nearest emergency  room or call 911.  A surgeon from [facility] is always on call at the hospital. ° °For further questions, please visit [email or web address]  ° °  Post Anesthesia Home Care Instructions ° °Activity: °Get plenty of rest for the remainder of the day. A responsible individual must stay with you for 24 hours following the procedure.  °For the next 24 hours, DO NOT: °-Drive a car °-Operate machinery °-Drink alcoholic beverages °-Take any medication unless instructed by your physician °-Make any legal decisions or sign important papers. ° °Meals: °Start with liquid foods such as gelatin or soup. Progress to regular foods as tolerated. Avoid greasy, spicy, heavy foods. If nausea and/or vomiting occur, drink only clear liquids until the nausea and/or vomiting subsides. Call your physician if vomiting continues. ° °Special Instructions/Symptoms: °Your throat may feel dry or sore from the anesthesia or the breathing tube placed in your throat during surgery. If this causes discomfort, gargle with warm salt water. The discomfort should disappear within 24 hours. ° °If you had a scopolamine patch placed behind your ear for the management of post- operative nausea and/or vomiting: ° °1. The medication in the patch is effective for 72 hours, after which it should be removed.  Wrap patch in a tissue and discard in the trash. Wash hands thoroughly with soap and water. °2. You may remove the patch earlier than 72 hours if you experience unpleasant side effects which may include dry mouth, dizziness or visual disturbances. °3. Avoid touching the patch. Wash your hands with soap and water after contact with the patch. °  ° ° °

## 2017-10-13 NOTE — Transfer of Care (Signed)
Immediate Anesthesia Transfer of Care Note  Patient: Megan Mcdonald  Procedure(s) Performed: LEFT BREAST LUMPECTOMY WITH RADIOACTIVE SEED LOCALIZATION (Left Breast)  Patient Location: PACU  Anesthesia Type:General  Level of Consciousness: awake and sedated  Airway & Oxygen Therapy: Patient Spontanous Breathing and Patient connected to face mask oxygen  Post-op Assessment: Report given to RN and Post -op Vital signs reviewed and stable  Post vital signs: Reviewed and stable  Last Vitals:  Vitals:   10/13/17 0647  BP: 135/82  Pulse: 64  Resp: 20  Temp: 36.7 C  SpO2: 97%    Last Pain:  Vitals:   10/13/17 0647  TempSrc: Oral         Complications: No apparent anesthesia complications

## 2017-10-13 NOTE — Interval H&P Note (Signed)
History and Physical Interval Note:  10/13/2017 7:01 AM  Megan Mcdonald  has presented today for surgery, with the diagnosis of LEFT BREAST [place]  The various methods of treatment have been discussed with the patient and family. After consideration of risks, benefits and other options for treatment, the patient has consented to  Procedure(s): LEFT BREAST LUMPECTOMY WITH RADIOACTIVE SEED LOCALIZATION (Left) as a surgical intervention .  The patient's history has been reviewed, patient examined, no change in status, stable for surgery.  I have reviewed the patient's chart and labs.  Questions were answered to the patient's satisfaction.     [name]

## 2017-10-16 ENCOUNTER — Encounter (HOSPITAL_BASED_OUTPATIENT_CLINIC_OR_DEPARTMENT_OTHER): Payer: Self-pay | Admitting: General Surgery

## 2017-10-16 NOTE — Progress Notes (Signed)
Inform patient of Pathology report,. Breast pathology shows no cancer. Breast pathology shows hyperplasia and complex sclerosing lesion.  This is obviously very good news I will discuss with her in detail at her next office visit Let me know that you got her  hmi

## 2017-10-23 ENCOUNTER — Ambulatory Visit: Payer: BC Managed Care – PPO | Admitting: Family Medicine

## 2017-10-31 ENCOUNTER — Encounter: Payer: BC Managed Care – PPO | Admitting: Internal Medicine

## 2018-02-08 ENCOUNTER — Other Ambulatory Visit: Payer: Self-pay | Admitting: Internal Medicine

## 2018-05-15 ENCOUNTER — Encounter: Payer: Self-pay | Admitting: Internal Medicine

## 2018-05-24 ENCOUNTER — Encounter: Payer: Self-pay | Admitting: Internal Medicine

## 2018-05-24 ENCOUNTER — Ambulatory Visit: Payer: BC Managed Care – PPO | Admitting: Internal Medicine

## 2018-05-24 ENCOUNTER — Ambulatory Visit (INDEPENDENT_AMBULATORY_CARE_PROVIDER_SITE_OTHER)
Admission: RE | Admit: 2018-05-24 | Discharge: 2018-05-24 | Disposition: A | Discharge status: Home / Self Care | Payer: BC Managed Care – PPO | Source: Ambulatory Visit | Attending: Internal Medicine | Admitting: Internal Medicine

## 2018-05-24 VITALS — BP 124/72 | HR 65 | Temp 98.1°F | Wt 177.0 lb

## 2018-05-24 DIAGNOSIS — R519 Headache, unspecified: Secondary | ICD-10-CM

## 2018-05-24 DIAGNOSIS — M542 Cervicalgia: Secondary | ICD-10-CM | POA: Diagnosis not present

## 2018-05-24 DIAGNOSIS — R51 Headache: Secondary | ICD-10-CM

## 2018-05-24 DIAGNOSIS — H9202 Otalgia, left ear: Secondary | ICD-10-CM

## 2018-05-24 MED ORDER — PREDNISONE 10 MG PO TABS: ORAL_TABLET | ORAL | 0 refills | Status: DC

## 2018-05-24 NOTE — Patient Instructions (Signed)

## 2018-05-24 NOTE — Progress Notes (Signed)
Subjective:    Patient ID: Megan Mcdonald, female    DOB: May 09, 1961, 57 y.o.   MRN: [id number]  HPI  Pt presents to the clinic today with c/o persistent left ear pain. This started about 1 year ago. She describes the pain as achy, full and burning. She report associated loss of hearing. She had an ear infection 01/2017, treated with Amoxil. She saw Tor Netters, 09/2017 for the same. Advised to try Flonase, antihistamines. Offered referral to ENT if symptoms persisted or worsened.  She also c/o daily headaches. The headache is located on the left side of her head and radiates into the left side of her neck. She describes the pain as stiffness. She reports associated left eye discomfort but denies visual changes or dizziness. She denies injury to the area. She has taken Aleve daily with minimal relief. She has an appt for an eye exam tommorow. She would like a referral to the [facility].  Review of Systems      Past Medical History:  Diagnosis Date  . Abnormal mammogram of left breast 10/13/2017  . Anxiety   . Depression   . Headache   . Left breast mass     Current Outpatient Medications  Medication Sig Dispense Refill  . diazepam (VALIUM) 2 MG tablet Take 1 tablet (2 mg total) by mouth every 8 (eight) hours as needed for anxiety. (Patient not taking: Reported on 10/11/2017) 30 tablet 0  . estradiol (VIVELLE-DOT) 0.1 MG/24HR patch PLACE 1 NEW PATCH ON THE SKIN TWICE A WEEK 8 patch 0  . hydrochlorothiazide (HYDRODIURIL) 25 MG tablet   2  . HYDROcodone-acetaminophen (NORCO) 5-325 MG tablet Take 1-2 tablets by mouth every 6 (six) hours as needed for moderate pain or severe pain. 30 tablet 0  . valACYclovir (VALTREX) 500 MG tablet Take 1 tab BID x 3 days if you have an outbreak 30 tablet 2   No current facility-administered medications for this visit.     No Known Allergies  Family History  Problem Relation Age of Onset  . Alcohol abuse Mother   . Heart disease  Father   . Cancer Neg Hx   . Diabetes Neg Hx   . Stroke Neg Hx     Social History   Socioeconomic History  . Marital status: Married    Spouse name: Not on file  . Number of children: Not on file  . Years of education: Not on file  . Highest education level: Not on file  Occupational History  . Not on file  Social Needs  . Financial resource strain: Not on file  . Food insecurity:    Worry: Not on file    Inability: Not on file  . Transportation needs:    Medical: Not on file    Non-medical: Not on file  Tobacco Use  . Smoking status: Never Smoker  . Smokeless tobacco: Never Used  Substance and Sexual Activity  . Alcohol use: No  . Drug use: No  . Sexual activity: Not Currently  Lifestyle  . Physical activity:    Days per week: Not on file    Minutes per session: Not on file  . Stress: Not on file  Relationships  . Social connections:    Talks on phone: Not on file    Gets together: Not on file    Attends religious service: Not on file    Active member of club or organization: Not on file    Attends  meetings of clubs or organizations: Not on file    Relationship status: Not on file  . Intimate partner violence:    Fear of current or ex partner: Not on file    Emotionally abused: Not on file    Physically abused: Not on file    Forced sexual activity: Not on file  Other Topics Concern  . Not on file  Social History Narrative  . Not on file     Constitutional: Pt reports headache. Denies fever, malaise, fatigue, or abrupt weight changes.  HEENT: Pt reports left eye discomfort, left ear pain, decreased hearing of left ear. Denies eye pain, eye redness, ringing in the ears, wax buildup, runny nose, nasal congestion, bloody nose, or sore throat. Respiratory: Denies difficulty breathing, shortness of breath, cough or sputum production.   Cardiovascular: Denies chest pain, chest tightness, palpitations or swelling in the hands or feet.  Musculoskeletal: Pt reports  left side neck pain. Denies decrease in range of motion, difficulty with gait, or joint swelling.  Skin: Denies redness, rashes, lesions or ulcercations.  Neurological: Denies dizziness, difficulty with memory, difficulty with speech or problems with balance and coordination.    No other specific complaints in a complete review of systems (except as listed in HPI above).  Objective:   Physical Exam   BP 124/72   Pulse 65   Temp 98.1 F (36.7 C) (Oral)   Wt 177 lb (80.3 kg)   SpO2 97%   BMI 28.57 kg/m  Wt Readings from Last 3 Encounters:  05/24/18 177 lb (80.3 kg)  10/13/17 185 lb (83.9 kg)  10/11/17 184 lb 4 oz (83.6 kg)    General: Appears her stated age, in NAD. HEENT: Head: normal shape and size, no sinus tenderness noted; Eyes: sclera white, no icterus, conjunctiva pink, PERRLA and EOMs intact; Ears: Tm's gray and intact, normal light reflex, + effusion bilaterally; Nose: mucosa pink and moist, septum midline; Throat/Mouth: Teeth present, mucosa erythematous and moist, = PND, no exudate, lesions or ulcerations noted.  Neck:  No adenopathy noted. Cardiovascular: Normal rate and rhythm. S1,S2 noted.  No murmur, rubs or gallops noted.  Pulmonary/Chest: Normal effort and positive vesicular breath sounds. No respiratory distress. No wheezes, rales or ronchi noted.  Musculoskeletal: Normal flexion, extension, rotation of the cervical spine. Bony tenderness noted over the cervical spine.  Neurological: Alert and oriented. Coordination normal.    BMET    Component Value Date/Time   NA 143 09/20/2016 1521   K 3.3 (L) 09/20/2016 1521   CL 106 09/20/2016 1521   CO2 30 09/20/2016 1521   GLUCOSE 79 09/20/2016 1521   BUN 15 09/20/2016 1521   CREATININE 0.94 09/20/2016 1521   CALCIUM 9.4 09/20/2016 1521    Lipid Panel     Component Value Date/Time   CHOL 207 (H) 09/20/2016 1521   TRIG 135.0 09/20/2016 1521   HDL 60.00 09/20/2016 1521   CHOLHDL 3 09/20/2016 1521   VLDL 27.0  09/20/2016 1521   LDLCALC 120 (H) 09/20/2016 1521    CBC    Component Value Date/Time   WBC 8.7 09/20/2016 1521   RBC 4.68 09/20/2016 1521   HGB 13.7 09/20/2016 1521   HCT 41.1 09/20/2016 1521   PLT 280.0 09/20/2016 1521   MCV 87.8 09/20/2016 1521   MCHC 33.3 09/20/2016 1521   RDW 13.0 09/20/2016 1521    Hgb A1C No results found for: HGBA1C         Assessment & Plan:  Otalgia, Left Ear:  Exam benign No s/s of infection She refuses to do Flonase eRx for Pred Taper x 6 days- avoid NSAID's Consider CT maxillofacial vs ENT referral if symptoms persist  Headache, Neck Pain:  ? Tension vs pinched nerve vs possible trigeminal neuralgia Xray cervical spine today eRx for Pred Taper x 6 days- avoid NSAID's Consider CT head for further evaluation vs referral to headache wellness center  Will follow up after xray, return precautions discussed [name], NP

## 2018-05-28 ENCOUNTER — Other Ambulatory Visit: Payer: Self-pay | Admitting: Internal Medicine

## 2018-05-28 ENCOUNTER — Encounter: Payer: Self-pay | Admitting: Internal Medicine

## 2018-05-31 NOTE — Telephone Encounter (Signed)
Last filled 02/08/18 w/ no refills... Pt has CPE 07/2018... Please advise

## 2018-06-22 ENCOUNTER — Encounter: Payer: Self-pay | Admitting: Internal Medicine

## 2018-06-22 ENCOUNTER — Ambulatory Visit: Payer: BC Managed Care – PPO | Admitting: Internal Medicine

## 2018-06-22 VITALS — BP 124/84 | HR 83 | Temp 98.1°F | Ht 67.0 in | Wt 181.0 lb

## 2018-06-22 DIAGNOSIS — S86911A Strain of unspecified muscle(s) and tendon(s) at lower leg level, right leg, initial encounter: Secondary | ICD-10-CM | POA: Diagnosis not present

## 2018-06-22 DIAGNOSIS — M25561 Pain in right knee: Secondary | ICD-10-CM | POA: Insufficient documentation

## 2018-06-22 NOTE — Patient Instructions (Signed)
Try 2 aleve twice a day for 1-2 weeks. Let pain guide your walking and activity. If no improvement in 2-3 weeks, we can consider a steroid injection.

## 2018-06-22 NOTE — Progress Notes (Signed)
Subjective:    Patient ID: Megan Mcdonald, female    DOB: April 02, 1961, 57 y.o.   MRN: [id number]  HPI Here due to knee pain--right  Has had some mild problems for years Uses elastic brace prn  Went to [employer] for yoga vacation (with her studio) No clear cut injury Pain increased since then----but didn't swell Since home, more pain and the brace is not helping  Has tried aleve 440mg ---not really helping  Current Outpatient Medications on File Prior to Visit  Medication Sig Dispense Refill  . diazepam (VALIUM) 2 MG tablet Take 1 tablet (2 mg total) by mouth every 8 (eight) hours as needed for anxiety. 30 tablet 0  . valACYclovir (VALTREX) 500 MG tablet Take 1 tab BID x 3 days if you have an outbreak 30 tablet 2  . estradiol (VIVELLE-DOT) 0.1 MG/24HR patch PLACE 1 NEW PATCH ON THE SKIN TWICE A WEEK (Patient not taking: Reported on 05/24/2018) 8 patch 0   No current facility-administered medications on file prior to visit.     No Known Allergies  Past Medical History:  Diagnosis Date  . Abnormal mammogram of left breast 10/13/2017  . Anxiety   . Depression   . Headache   . Left breast mass     Past Surgical History:  Procedure Laterality Date  . ABDOMINAL HYSTERECTOMY  1990   Partial  . BREAST LUMPECTOMY WITH RADIOACTIVE SEED LOCALIZATION Left 10/13/2017   Procedure: LEFT BREAST LUMPECTOMY WITH RADIOACTIVE SEED LOCALIZATION;  Surgeon: Fanny Skates, MD;  Location: [facility];  Service: General;  Laterality: Left;  . TUBAL LIGATION      Family History  Problem Relation Age of Onset  . Alcohol abuse Mother   . Heart disease Father   . Cancer Neg Hx   . Diabetes Neg Hx   . Stroke Neg Hx     Social History   Socioeconomic History  . Marital status: Married    Spouse name: Not on file  . Number of children: Not on file  . Years of education: Not on file  . Highest education level: Not on file  Occupational History  . Not on file  Social Needs    . Financial resource strain: Not on file  . Food insecurity:    Worry: Not on file    Inability: Not on file  . Transportation needs:    Medical: Not on file    Non-medical: Not on file  Tobacco Use  . Smoking status: Never Smoker  . Smokeless tobacco: Never Used  Substance and Sexual Activity  . Alcohol use: No  . Drug use: No  . Sexual activity: Not Currently  Lifestyle  . Physical activity:    Days per week: Not on file    Minutes per session: Not on file  . Stress: Not on file  Relationships  . Social connections:    Talks on phone: Not on file    Gets together: Not on file    Attends religious service: Not on file    Active member of club or organization: Not on file    Attends meetings of clubs or organizations: Not on file    Relationship status: Not on file  . Intimate partner violence:    Fear of current or ex partner: Not on file    Emotionally abused: Not on file    Physically abused: Not on file    Forced sexual activity: Not on file  Other Topics Concern  .  Not on file  Social History Narrative  . 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

## 2018-06-22 NOTE — Assessment & Plan Note (Signed)
No sig injury Findings don't support concern for sig OA, or ligament/meniscus issues Discussed aleve 2 bid for 1-2 weeks Stronger brace Consider steroid injection if not improving Needs to work on quad strength

## 2018-06-25 ENCOUNTER — Ambulatory Visit: Payer: BC Managed Care – PPO | Admitting: Internal Medicine

## 2018-07-17 ENCOUNTER — Encounter: Payer: BC Managed Care – PPO | Admitting: Internal Medicine

## 2018-09-26 ENCOUNTER — Ambulatory Visit: Payer: BC Managed Care – PPO | Admitting: Internal Medicine

## 2018-09-26 ENCOUNTER — Encounter: Payer: Self-pay | Admitting: Internal Medicine

## 2018-09-26 VITALS — BP 132/88 | HR 84 | Temp 97.8°F | Ht 67.0 in | Wt 183.0 lb

## 2018-09-26 DIAGNOSIS — M25561 Pain in right knee: Secondary | ICD-10-CM | POA: Diagnosis not present

## 2018-09-26 NOTE — Progress Notes (Signed)
Subjective:    Patient ID: Megan Mcdonald, female    DOB: [birth year], 57 y.o.   MRN: [id number]  HPI Here due to ongoing right knee problems  Did try aleve and stronger brace Knee did seem better for a while Then got worse--much worse Last week, got so bad that she could barely stand up  Ice no help No swelling  Current Outpatient Medications on File Prior to Visit  Medication Sig Dispense Refill  . diazepam (VALIUM) 2 MG tablet Take 1 tablet (2 mg total) by mouth every 8 (eight) hours as needed for anxiety. 30 tablet 0  . estradiol (VIVELLE-DOT) 0.1 MG/24HR patch PLACE 1 NEW PATCH ON THE SKIN TWICE A WEEK 8 patch 0  . valACYclovir (VALTREX) 500 MG tablet Take 1 tab BID x 3 days if you have an outbreak 30 tablet 2   No current facility-administered medications on file prior to visit.     No Known Allergies  Past Medical History:  Diagnosis Date  . Abnormal mammogram of left breast 10/13/2017  . Anxiety   . Depression   . Headache   . Left breast mass     Past Surgical History:  Procedure Laterality Date  . ABDOMINAL HYSTERECTOMY  1990   Partial  . BREAST LUMPECTOMY WITH RADIOACTIVE SEED LOCALIZATION Left 10/13/2017   Procedure: LEFT BREAST LUMPECTOMY WITH RADIOACTIVE SEED LOCALIZATION;  Surgeon: Fanny Skates, MD;  Location: [facility];  Service: General;  Laterality: Left;  . TUBAL LIGATION      Family History  Problem Relation Age of Onset  . Alcohol abuse Mother   . Heart disease Father   . Cancer Neg Hx   . Diabetes Neg Hx   . Stroke Neg Hx     Social History   Socioeconomic History  . Marital status: Married    Spouse name: Not on file  . Number of children: Not on file  . Years of education: Not on file  . Highest education level: Not on file  Occupational History  . Not on file  Social Needs  . Financial resource strain: Not on file  . Food insecurity:    Worry: Not on file    Inability: Not on file  . Transportation needs:    Medical: Not on file    Non-medical: Not on file  Tobacco Use  . Smoking status: Never Smoker  . Smokeless tobacco: Never Used  Substance and Sexual Activity  . Alcohol use: No  . Drug use: No  . Sexual activity: Not Currently  Lifestyle  . Physical activity:    Days per week: Not on file    Minutes per session: Not on file  . Stress: Not on file  Relationships  . Social connections:    Talks on phone: Not on file    Gets together: Not on file    Attends religious service: Not on file    Active member of club or organization: Not on file    Attends meetings of clubs or organizations: Not on file    Relationship status: Not on file  . Intimate partner violence:    Fear of current or ex partner: Not on file    Emotionally abused: Not on file    Physically abused: Not on file    Forced sexual activity: Not on file  Other Topics Concern  . Not on file  Social History Narrative  . Not on file   Review of Systems  No  fever No other joint problems     Objective:   Physical Exam  Constitutional: No distress.  Musculoskeletal:  No sig right knee swelling Tenderness medial inferior patella border Patellar clunk with passive flexion---seems to sublux some No clear meniscus or ligament findings  Neurological:  Antalgic gait           Assessment & Plan:

## 2018-09-26 NOTE — Assessment & Plan Note (Signed)
Going on for 3 months May be instability in patellar ligament  Can't exclude meniscus injury Quite disabling at this point Will proceed with ortho evaluation

## 2018-10-03 ENCOUNTER — Ambulatory Visit (INDEPENDENT_AMBULATORY_CARE_PROVIDER_SITE_OTHER): Payer: BC Managed Care – PPO | Admitting: Orthopaedic Surgery

## 2018-10-03 ENCOUNTER — Ambulatory Visit (INDEPENDENT_AMBULATORY_CARE_PROVIDER_SITE_OTHER): Payer: BC Managed Care – PPO

## 2018-10-03 ENCOUNTER — Encounter (INDEPENDENT_AMBULATORY_CARE_PROVIDER_SITE_OTHER): Payer: Self-pay | Admitting: Orthopaedic Surgery

## 2018-10-03 DIAGNOSIS — G8929 Other chronic pain: Secondary | ICD-10-CM | POA: Diagnosis not present

## 2018-10-03 DIAGNOSIS — M25561 Pain in right knee: Secondary | ICD-10-CM

## 2018-10-03 MED ORDER — METHYLPREDNISOLONE ACETATE 40 MG/ML IJ SUSP
40.0000 mg | INTRAMUSCULAR | Status: AC | PRN
  Administered 2018-10-03: 40 mg via INTRA_ARTICULAR

## 2018-10-03 MED ORDER — LIDOCAINE HCL 1 % IJ SOLN
3.0000 mL | INTRAMUSCULAR | Status: AC | PRN
  Administered 2018-10-03: 3 mL

## 2018-10-03 NOTE — Progress Notes (Signed)
Office Visit Note   Patient: Megan Mcdonald           Date of Birth: 1961-05-24           MRN: [id number] Visit Date: 10/03/2018              Requested by: Venia Carbon, MD [place],  [id number] PCP: Jearld Fenton, NP   Assessment & Plan: Visit Diagnoses:  1. Chronic pain of right knee     Plan: Her physical exam is definitely impressive in terms of my feeling that she has an acute meniscal tear with a flap component.  I did place a steroid injection in her knee to try to calm down her pain but an MRI is medically warranted at this point to rule out a meniscal tear or even a stress fracture.  I am also concerned about the medial collateral ligament area a.  She agrees with the MRI.  She will avoid pivoting activities and continue her knee brace until we see her back after the MRI of her knee.  All question concerns were answered and addressed.  She did tolerate steroid injection in her knee well today without difficulties.  Follow-Up Instructions: Return in about 2 weeks (around 10/17/2018).   Orders:  Orders Placed This Encounter  Procedures  . Large Joint Inj  . XR Knee 1-2 Views Right   No orders of the defined types were placed in this encounter.     Procedures: Large Joint Inj: R knee on 10/03/2018 4:25 PM Indications: diagnostic evaluation and pain Details: 22 G 1.5 in needle, superolateral approach  Arthrogram: No  Medications: 3 mL lidocaine 1 %; 40 mg methylPREDNISolone acetate 40 MG/ML Outcome: tolerated well, no immediate complications Procedure, treatment alternatives, risks and benefits explained, specific risks discussed. Consent was given by the patient. Immediately prior to procedure a time out was called to verify the correct patient, procedure, equipment, support staff and site/side marked as required. Patient was prepped and draped in the usual sterile fashion.       Clinical Data: No additional  findings.   Subjective: Chief Complaint  Patient presents with  . Right Knee - Pain  The patient comes in today with right knee pain and locking catching is been going on since she went to a yoga retreat in June of this year.  She points the medial side of her knee as a source of her pain.  Another physician sent her my way after a McMurray's exam cause severe pain in her knee.  Pivoting activities are very painful to her.  She is tried a knee brace and activity modification.  She is taking anti-inflammatories as well.  She is walking with a significant limp.  Her pain is daily and is more activity related.  HPI  Review of Systems She currently denies any headache, chest pain, shortness of breath, fever, chills, nausea, vomiting.  Objective: Vital Signs: There were no vitals taken for this visit.  Physical Exam She is alert and oriented x3 and in no acute distress Ortho Exam Examination of her right knee shows a positive McMurray sign the medial compartment with severe pain in the medial compartment of her knee.  If I flex her past 90 degrees she has significantly severe pain in the knee.  Her Lachman's is negative. Specialty Comments:  No specialty comments available.  Imaging: Xr Knee 1-2 Views Right  Result Date: 10/03/2018 2 views of the right knee  show no acute findings.  The joint spaces well-maintained in all 3 compartments.  There is no effusion.  The alignment is anatomic.    PMFS History: Patient Active Problem List   Diagnosis Date Noted  . Right knee pain 06/22/2018  . Postmenopausal 07/10/2014   Past Medical History:  Diagnosis Date  . Abnormal mammogram of left breast 10/13/2017  . Anxiety   . Depression   . Headache   . Left breast mass     Family History  Problem Relation Age of Onset  . Alcohol abuse Mother   . Heart disease Father   . Cancer Neg Hx   . Diabetes Neg Hx   . Stroke Neg Hx     Past Surgical History:  Procedure Laterality Date  .  ABDOMINAL HYSTERECTOMY  1990   Partial  . BREAST LUMPECTOMY WITH RADIOACTIVE SEED LOCALIZATION Left 10/13/2017   Procedure: LEFT BREAST LUMPECTOMY WITH RADIOACTIVE SEED LOCALIZATION;  Surgeon: Fanny Skates, MD;  Location: [facility];  Service: General;  Laterality: Left;  . TUBAL LIGATION     Social History   Occupational History  . Occupation: Pharmacist, hospital    Comment: [employer]  Tobacco Use  . Smoking status: Never Smoker  . Smokeless tobacco: Never Used  Substance and Sexual Activity  . Alcohol use: No  . Drug use: No  . Sexual activity: Not Currently

## 2018-10-04 ENCOUNTER — Other Ambulatory Visit (INDEPENDENT_AMBULATORY_CARE_PROVIDER_SITE_OTHER): Payer: Self-pay

## 2018-10-04 DIAGNOSIS — G8929 Other chronic pain: Secondary | ICD-10-CM

## 2018-10-04 DIAGNOSIS — M25561 Pain in right knee: Principal | ICD-10-CM

## 2018-10-14 ENCOUNTER — Other Ambulatory Visit: Payer: BC Managed Care – PPO

## 2018-10-15 ENCOUNTER — Telehealth (INDEPENDENT_AMBULATORY_CARE_PROVIDER_SITE_OTHER): Payer: Self-pay | Admitting: Orthopaedic Surgery

## 2018-10-15 NOTE — Telephone Encounter (Signed)
Patient called advised the MRI will cost her $800.00 and she do not have that kind of money. Patient said she is not sure that she should keep her appointment with Dr Ninfa Linden without having had the MRI. The number to contact patient is [phone]

## 2018-10-15 NOTE — Telephone Encounter (Signed)
Please advise what you want her to do?

## 2018-10-16 NOTE — Telephone Encounter (Signed)
LMOM for patient to call back and schedule an appt with [facility] for an ultrasound

## 2018-10-16 NOTE — Telephone Encounter (Signed)
Only other thing to do is just see how she feels.  We would still need a MRI to see if she has torn anything if she continues to have problems.  Does not need a follow-up for now.

## 2018-10-16 NOTE — Telephone Encounter (Signed)
sure

## 2018-10-16 NOTE — Telephone Encounter (Signed)
Could we have her see [facility] for ultrasound?

## 2018-10-17 ENCOUNTER — Ambulatory Visit (INDEPENDENT_AMBULATORY_CARE_PROVIDER_SITE_OTHER): Payer: BC Managed Care – PPO | Admitting: Orthopaedic Surgery

## 2018-10-22 ENCOUNTER — Ambulatory Visit (INDEPENDENT_AMBULATORY_CARE_PROVIDER_SITE_OTHER): Payer: BC Managed Care – PPO | Admitting: Family Medicine

## 2018-12-07 ENCOUNTER — Telehealth: Payer: Self-pay

## 2018-12-07 NOTE — Telephone Encounter (Signed)
Front desk gave me note that pt had scheduled a mychart appt for lt sided h/a that was worsening. I spoke with pt and she said most of her adult life she has had issues with H/A. For the past month pt has had pressure behind Lt eye; no vision problems; pt wonders if could be sinus but she is not sure why having pressure feeling behind Lt eye. The entire lt side of pts head hurts; the pain level varies from 5 - 10 . Pt has taken sudafed but has not helped.pt only takes Aleve if pain level goes to 12 due to stomach issues.  Pt said she can wait until 12/10/18 at 8:45 to be seen and is aware if condition worsens prior to appt pt will go to ED. FYI to Avie Echevaria NP.

## 2018-12-07 NOTE — Telephone Encounter (Signed)
Noted, agree with advice given 

## 2018-12-10 ENCOUNTER — Ambulatory Visit: Payer: BC Managed Care – PPO | Admitting: Internal Medicine

## 2018-12-10 ENCOUNTER — Encounter: Payer: Self-pay | Admitting: Internal Medicine

## 2018-12-10 DIAGNOSIS — G4452 New daily persistent headache (NDPH): Secondary | ICD-10-CM

## 2018-12-10 NOTE — Patient Instructions (Signed)
General Headache Without Cause A headache is pain or discomfort that is felt around the head or neck area. There are many causes and types of headaches. In some cases, the cause may not be found. Follow these instructions at home: Watch your condition for any changes. Let your doctor know about them. Take these steps to help with your condition: Managing pain      Take over-the-counter and prescription medicines only as told by your doctor.  Lie down in a dark, quiet room when you have a headache.  If told, put ice on your head and neck area: ? Put ice in a plastic bag. ? Place a towel between your skin and the bag. ? Leave the ice on for 20 minutes, 2-3 times per day.  If told, put heat on the affected area. Use the heat source that your doctor recommends, such as a moist heat pack or a heating pad. ? Place a towel between your skin and the heat source. ? Leave the heat on for 20-30 minutes. ? Remove the heat if your skin turns bright red. This is very important if you are unable to feel pain, heat, or cold. You may have a greater risk of getting burned.  Keep lights dim if bright lights bother you or make your headaches worse. Eating and drinking  Eat meals on a regular schedule.  If you drink alcohol: ? Limit how much you use to:  0-1 drink a day for women.  0-2 drinks a day for men. ? Be aware of how much alcohol is in your drink. In the [place] one drink equals one 12 oz bottle of beer (355 mL), one 5 oz glass of wine (148 mL), or one 1 oz glass of hard liquor (44 mL).  Stop drinking caffeine, or reduce how much caffeine you drink. General instructions   Keep a journal to find out if certain things bring on headaches. For example, write down: ? What you eat and drink. ? How much sleep you get. ? Any change to your diet or medicines.  Get a massage or try other ways to relax.  Limit stress.  Sit up straight. Do not tighten (tense) your muscles.  Do not use any  products that contain nicotine or tobacco. This includes cigarettes, e-cigarettes, and chewing tobacco. If you need help quitting, ask your doctor.  Exercise regularly as told by your doctor.  Get enough sleep. This often means 7-9 hours of sleep each night.  Keep all follow-up visits as told by your doctor. This is important. Contact a doctor if:  Your symptoms are not helped by medicine.  You have a headache that feels different than the other headaches.  You feel sick to your stomach (nauseous) or you throw up (vomit).  You have a fever. Get help right away if:  Your headache gets very bad quickly.  Your headache gets worse after a lot of physical activity.  You keep throwing up.  You have a stiff neck.  You have trouble seeing.  You have trouble speaking.  You have pain in the eye or ear.  Your muscles are weak or you lose muscle control.  You lose your balance or have trouble walking.  You feel like you will pass out (faint) or you pass out.  You are mixed up (confused).  You have a seizure. Summary  A headache is pain or discomfort that is felt around the head or neck area.  There are many causes and   types of headaches. In some cases, the cause may not be found.  Keep a journal to help find out what causes your headaches. Watch your condition for any changes. Let your doctor know about them.  Contact a doctor if you have a headache that is different from usual, or if your headache is not helped by medicine.  Get help right away if your headache gets very bad, you throw up, you have trouble seeing, you lose your balance, or you have a seizure. This information is not intended to replace advice given to you by your health care provider. Make sure you discuss any questions you have with your health care provider. Document Released: 09/06/2008 Document Revised: 06/18/2018 Document Reviewed: 06/18/2018 Elsevier Interactive Patient Education  2019 Elsevier  Inc.  

## 2018-12-10 NOTE — Progress Notes (Signed)
Subjective:    Patient ID: Megan Mcdonald, female    DOB: 09/23/1961, 57 y.o.   MRN: [id number]  HPI  Pt presents to the clinic today to discuss her headaches. She reports these occur daily. The headache is located on the left side of her head and radiates into the left side of her neck. She describes the pain as stiffness. She reports associated left eye discomfort but denies visual changes or dizziness. She reports the pressure behind the left eye is constant. She describes this as achy, pressure which is worse with coughing or sneezing. She did have some nasal congestion, but now has a runny nose. She denies ear pain, sore throat or cough. She denies sensitivity to light, sound, nausea or vomiting. She takes Aleve with minimal relief. She has had her eyes checked 05/2018 which she reports was normal. She had an xray of the cervical spine 05/2018 which showed:  IMPRESSION: Diffuse degenerative change, particularly prominent C4-C5, C5-C6, and C6-C7. No acute bony abnormality.  She was initially treated with Prednisone for this, but she reports no improvement since that time. She has also recently tried Flonase, Sudafed, and Zicam which seems to make her pressure worse.   Review of Systems      Past Medical History:  Diagnosis Date  . Abnormal mammogram of left breast 10/13/2017  . Anxiety   . Depression   . Headache   . Left breast mass     Current Outpatient Medications  Medication Sig Dispense Refill  . estradiol (VIVELLE-DOT) 0.1 MG/24HR patch PLACE 1 NEW PATCH ON THE SKIN TWICE A WEEK 8 patch 0  . valACYclovir (VALTREX) 500 MG tablet Take 1 tab BID x 3 days if you have an outbreak 30 tablet 2   No current facility-administered medications for this visit.     No Known Allergies  Family History  Problem Relation Age of Onset  . Alcohol abuse Mother   . Heart disease Father   . Cancer Neg Hx   . Diabetes Neg Hx   . Stroke Neg Hx     Social History   Socioeconomic  History  . Marital status: Married    Spouse name: Not on file  . Number of children: Not on file  . Years of education: Not on file  . Highest education level: Not on file  Occupational History  . Occupation: Pharmacist, hospital    Comment: [employer] middle  Social Needs  . Financial resource strain: Not on file  . Food insecurity:    Worry: Not on file    Inability: Not on file  . Transportation needs:    Medical: Not on file    Non-medical: Not on file  Tobacco Use  . Smoking status: Never Smoker  . Smokeless tobacco: Never Used  Substance and Sexual Activity  . Alcohol use: No  . Drug use: No  . Sexual activity: Not Currently  Lifestyle  . Physical activity:    Days per week: Not on file    Minutes per session: Not on file  . Stress: Not on file  Relationships  . Social connections:    Talks on phone: Not on file    Gets together: Not on file    Attends religious service: Not on file    Active member of club or organization: Not on file    Attends meetings of clubs or organizations: Not on file    Relationship status: Not on file  . Intimate partner violence:  Fear of current or ex partner: Not on file    Emotionally abused: Not on file    Physically abused: Not on file    Forced sexual activity: Not on file  Other Topics Concern  . Not on file  Social History Narrative  . Not on file     Constitutional: Pt reports headache. Denies fever, malaise, fatigue, or abrupt weight changes.  HEENT: Pt reports pressure behind left eye, runny nose. Denies eye redness, ear pain, ringing in the ears, wax buildup, nasal congestion, bloody nose, or sore throat. Respiratory: Denies difficulty breathing, shortness of breath, cough or sputum production.   Cardiovascular: Denies chest pain, chest tightness, palpitations or swelling in the hands or feet.  Gastrointestinal: Denies abdominal pain, bloating, constipation, diarrhea or blood in the stool.  Musculoskeletal: Pt reports  left side neck pain. Denies decrease in range of motion, difficulty with gait, or joint swelling.  Neurological: Denies dizziness, difficulty with memory, difficulty with speech or problems with balance and coordination.    No other specific complaints in a complete review of systems (except as listed in HPI above).  Objective:   Physical Exam   BP 126/82   Pulse 82   Temp 97.9 F (36.6 C) (Oral)   Wt 183 lb (83 kg)   SpO2 97%   BMI 28.66 kg/m  Wt Readings from Last 3 Encounters:  12/10/18 183 lb (83 kg)  09/26/18 183 lb (83 kg)  06/22/18 181 lb (82.1 kg)    General: Appears her stated age, well developed, well nourished in NAD. HEENT: Head: normal shape and size, ethmoidal sinus tenderness noted on the left; Eyes: sclera white, no icterus, conjunctiva pink, PERRLA and EOMs intact; Ears: Tm's gray and intact, normal light reflex;  Cardiovascular: Normal rate and rhythm. S1,S2 noted.  No murmur, rubs or gallops noted. Pulmonary/Chest: Normal effort and positive vesicular breath sounds. No respiratory distress. No wheezes, rales or ronchi noted.  Musculoskeletal: Normal flexion, extension and rotation of the cervical spine. Bony tenderness noted over the lower cervical [place]. No pain with palpation of the left paracervical muscles. Neurological: Alert and oriented. Cranial nerves II-XII grossly intact. Coordination normal.    BMET    Component Value Date/Time   NA 143 09/20/2016 1521   K 3.3 (L) 09/20/2016 1521   CL 106 09/20/2016 1521   CO2 30 09/20/2016 1521   GLUCOSE 79 09/20/2016 1521   BUN 15 09/20/2016 1521   CREATININE 0.94 09/20/2016 1521   CALCIUM 9.4 09/20/2016 1521    Lipid Panel     Component Value Date/Time   CHOL 207 (H) 09/20/2016 1521   TRIG 135.0 09/20/2016 1521   HDL 60.00 09/20/2016 1521   CHOLHDL 3 09/20/2016 1521   VLDL 27.0 09/20/2016 1521   LDLCALC 120 (H) 09/20/2016 1521    CBC    Component Value Date/Time   WBC 8.7 09/20/2016 1521    RBC 4.68 09/20/2016 1521   HGB 13.7 09/20/2016 1521   HCT 41.1 09/20/2016 1521   PLT 280.0 09/20/2016 1521   MCV 87.8 09/20/2016 1521   MCHC 33.3 09/20/2016 1521   RDW 13.0 09/20/2016 1521    Hgb A1C No results found for: HGBA1C         Assessment & Plan:   New Daily Persistent Headaches:  ? Sinus vs Tension vs a combination of both Medications seems to make it worse Will obtain CT head without contrast for further evaluation of daily persistent headaches Will obtain CT maxillofacial  to r/o chronic sinusitis, other sinus issues May need referral to neurology vs ENT vs both, pending CT results Vision screening 20/25, will get copy of eye exam from 05/2018.  Will follow up after CT results are back [name], NP

## 2018-12-19 ENCOUNTER — Other Ambulatory Visit: Payer: BC Managed Care – PPO

## 2019-04-01 ENCOUNTER — Encounter: Payer: BC Managed Care – PPO | Admitting: Internal Medicine

## 2019-04-01 ENCOUNTER — Ambulatory Visit (INDEPENDENT_AMBULATORY_CARE_PROVIDER_SITE_OTHER): Payer: BC Managed Care – PPO | Admitting: Internal Medicine

## 2019-04-01 ENCOUNTER — Encounter: Payer: Self-pay | Admitting: Internal Medicine

## 2019-04-01 DIAGNOSIS — M2550 Pain in unspecified joint: Secondary | ICD-10-CM

## 2019-04-01 DIAGNOSIS — Z13220 Encounter for screening for lipoid disorders: Secondary | ICD-10-CM | POA: Diagnosis not present

## 2019-04-01 DIAGNOSIS — R5383 Other fatigue: Secondary | ICD-10-CM | POA: Diagnosis not present

## 2019-04-01 DIAGNOSIS — R21 Rash and other nonspecific skin eruption: Secondary | ICD-10-CM | POA: Diagnosis not present

## 2019-04-01 MED ORDER — VALACYCLOVIR HCL 500 MG PO TABS: ORAL_TABLET | ORAL | 2 refills | Status: DC

## 2019-04-01 NOTE — Patient Instructions (Signed)
Joint Pain  Joint pain can be caused by many things. It is likely to go away if you follow instructions from your doctor for taking care of yourself at home. Sometimes, you may need more treatment. Follow these instructions at home: Managing pain, stiffness, and swelling   If told, put ice on the painful area. ? Put ice in a plastic bag. ? Place a towel between your skin and the bag. ? Leave the ice on for 20 minutes, 2-3 times a day.  If told, put heat on the painful area. Do this as often as told by your doctor. Use the heat source that your doctor recommends, such as a moist heat pack or a heating pad. ? Place a towel between your skin and the heat source. ? Leave the heat on for 20-30 minutes. ? Take off the heat if your skin gets bright red. This is especially important if you are unable to feel pain, heat, or cold. You may have a greater risk of getting burned.  Move your fingers or toes below the painful joint often. This helps with stiffness and swelling.  If possible, raise (elevate) the painful joint above the level of your heart while you are sitting or lying down. To do this, try putting a few pillows under the painful joint. Activity  Rest the painful joint for as long as told. Do not do things that cause pain or make your pain worse.  Begin exercising or stretching the affected area, as told by your doctor. Ask your doctor what types of exercise are safe for you. If you have an elastic bandage, sling, or splint:  Wear the device as told by your doctor. Take it off only as told by your doctor.  Loosen the device if your fingers or toes below the joint: ? Tingle. ? Lose feeling (get numb). ? Get cold and blue.  Keep the device clean.  Ask your doctor if you should take off the device before bathing. You may need to cover it with a watertight covering when you take a bath or a shower. General instructions  Take over-the-counter and prescription medicines only as told  by your doctor.  Do not use any products that contain nicotine or tobacco. These include cigarettes and e-cigarettes. If you need help quitting, ask your doctor.  Keep all follow-up visits as told by your doctor. This is important. Contact a doctor if:  You have pain that gets worse and does not get better with medicine.  Your joint pain does not get better in 3 days.  You have more bruising or swelling.  You have a fever.  You lose 10 lb (4.5 kg) or more without trying. Get help right away if:  You cannot move the joint.  Your fingers or toes tingle, lose feeling, or get cold and blue.  You have a fever along with a joint that is red, warm, and swollen. Summary  Joint pain can be caused by many things. It often goes away if you follow instructions from your doctor for taking care of yourself at home.  Rest the painful joint for as long as told. Do not do things that cause pain or make your pain worse.  Take over-the-counter and prescription medicines only as told by your doctor. This information is not intended to replace advice given to you by your health care provider. Make sure you discuss any questions you have with your health care provider. Document Released: 11/16/2009 Document Revised: 09/13/2017 Document   Reviewed: 09/13/2017 Elsevier Interactive Patient Education  2019 [employer] Inc[employer]  

## 2019-04-01 NOTE — Progress Notes (Signed)
Virtual Visit via Video Note  I connected with Megan Mcdonald on 04/01/19 at  8:15 AM EDT by a video enabled telemedicine application and verified that I am speaking with the correct person using two identifiers.   I discussed the limitations of evaluation and management by telemedicine and the availability of in person appointments. The patient expressed understanding and agreed to proceed.  Patient Location: Home Provider Location: Office  History of Present Illness:  Pt request to talk to me about her medication. She reports she only has to take the Valtrex 2-3 per year for outbreak of genital herpes. She reports when she takes the Valtrex, her energy is better, her joints don't ache, her face clears up. She is wondering why she feels good when she takes the antiviral medication, and wonders if something else is going on. She would like a refill of Valtrex today.  She reports she doesn't sleep well due to hot flashes and night sweats, secondary to menopause. Her mood is good given there current circumstances. She is working from home, which is a little stressful for her. Her appetite is normal. She denies chest pain or shortness of breath. She denies issues with her bowel or bladder. She reports her joints ache most days, but she denies joint swelling, redness or warmth.    Observations/Objective:  Alert and oriented x 3 NAD Obese Normal range of motion of shoulders, elbows, knees No joint swelling noted Well kempt Behavior, judgement and thought content are normal  Assessment and Plan:  Fatigue, Joint Pain, Facial Rash:  Will obtain lab only CBC, CMET, TSH, Vit D, B12, ANA, RF, ESR, CRP, Lyme antibody test  Screen for HLD:  Will obtain lab on lipid profile  Will follow up after labs are back. Valtrex refilled today. No medication changes at this time.    Follow Up Instructions:    I discussed the assessment and treatment plan with the patient. The patient was provided an  opportunity to ask questions and all were answered. The patient agreed with the plan and demonstrated an understanding of the instructions.   The patient was advised to call back or seek an in-person evaluation if the symptoms worsen or if the condition fails to improve as anticipated.     [name], NP

## 2019-04-02 ENCOUNTER — Other Ambulatory Visit (INDEPENDENT_AMBULATORY_CARE_PROVIDER_SITE_OTHER): Payer: BC Managed Care – PPO

## 2019-04-02 ENCOUNTER — Other Ambulatory Visit: Payer: Self-pay

## 2019-04-02 DIAGNOSIS — R5383 Other fatigue: Secondary | ICD-10-CM

## 2019-04-02 DIAGNOSIS — Z13220 Encounter for screening for lipoid disorders: Secondary | ICD-10-CM | POA: Diagnosis not present

## 2019-04-02 DIAGNOSIS — M2550 Pain in unspecified joint: Secondary | ICD-10-CM

## 2019-04-02 DIAGNOSIS — R21 Rash and other nonspecific skin eruption: Secondary | ICD-10-CM

## 2019-04-02 LAB — LIPID PANEL
Cholesterol: 258 mg/dL — ABNORMAL HIGH (ref 0–200)
HDL: 59.9 mg/dL (ref >39.00)
LDL Cholesterol: 184 mg/dL — ABNORMAL HIGH (ref 0–99)
NonHDL: 198.52
Total CHOL/HDL Ratio: 4
Triglycerides: 75 mg/dL (ref 0.0–149.0)
VLDL: 15 mg/dL (ref 0.0–40.0)

## 2019-04-02 LAB — COMPREHENSIVE METABOLIC PANEL
ALT: 41 U/L — ABNORMAL HIGH (ref 0–35)
AST: 21 U/L (ref 0–37)
Albumin: 4.1 g/dL (ref 3.5–5.2)
Alkaline Phosphatase: 113 U/L (ref 39–117)
BUN: 18 mg/dL (ref 6–23)
CO2: 26 mEq/L (ref 19–32)
Calcium: 9.4 mg/dL (ref 8.4–10.5)
Chloride: 106 mEq/L (ref 96–112)
Creatinine, Ser: 0.72 mg/dL (ref 0.40–1.20)
GFR: 82.93 mL/min (ref >60.00)
Glucose, Bld: 86 mg/dL (ref 70–99)
Potassium: 3.8 mEq/L (ref 3.5–5.1)
Sodium: 141 mEq/L (ref 135–145)
Total Bilirubin: 0.3 mg/dL (ref 0.2–1.2)
Total Protein: 7.3 g/dL (ref 6.0–8.3)

## 2019-04-02 LAB — HIGH SENSITIVITY CRP: CRP, High Sensitivity: 12.14 mg/L — ABNORMAL HIGH (ref 0.000–5.000)

## 2019-04-02 LAB — CBC
HCT: 42.6 % (ref 36.0–46.0)
Hemoglobin: 14.5 g/dL (ref 12.0–15.0)
MCHC: 34 g/dL (ref 30.0–36.0)
MCV: 87.7 fl (ref 78.0–100.0)
Platelets: 262 10*3/uL (ref 150.0–400.0)
RBC: 4.85 Mil/uL (ref 3.87–5.11)
RDW: 13.4 % (ref 11.5–15.5)
WBC: 6.7 10*3/uL (ref 4.0–10.5)

## 2019-04-02 LAB — VITAMIN D 25 HYDROXY (VIT D DEFICIENCY, FRACTURES): VITD: 29.47 ng/mL — ABNORMAL LOW (ref 30.00–100.00)

## 2019-04-02 LAB — TSH: TSH: 2.62 u[IU]/mL (ref 0.35–4.50)

## 2019-04-02 LAB — VITAMIN B12: Vitamin B-12: 298 pg/mL (ref 211–911)

## 2019-04-02 LAB — SEDIMENTATION RATE: Sed Rate: 46 mm/hr — ABNORMAL HIGH (ref 0–30)

## 2019-04-04 LAB — ANA: Anti Nuclear Antibody (ANA): POSITIVE — AB

## 2019-04-04 LAB — ANTI-NUCLEAR AB-TITER (ANA TITER): ANA Titer 1: 1:80 {titer} — ABNORMAL HIGH

## 2019-04-04 LAB — RHEUMATOID FACTOR: Rheumatoid fact SerPl-aCnc: <14 IU/mL (ref <14)

## 2019-04-10 ENCOUNTER — Other Ambulatory Visit: Payer: Self-pay | Admitting: Internal Medicine

## 2019-04-10 DIAGNOSIS — M2550 Pain in unspecified joint: Secondary | ICD-10-CM

## 2019-04-10 DIAGNOSIS — R7982 Elevated C-reactive protein (CRP): Secondary | ICD-10-CM

## 2019-04-10 DIAGNOSIS — R768 Other specified abnormal immunological findings in serum: Secondary | ICD-10-CM

## 2019-04-10 DIAGNOSIS — M791 Myalgia, unspecified site: Secondary | ICD-10-CM

## 2019-04-30 ENCOUNTER — Telehealth: Payer: Self-pay | Admitting: *Deleted

## 2019-04-30 NOTE — Telephone Encounter (Signed)
I called [facility] and they did get Rx and will get ready for pt... pt is aware as instructed

## 2019-04-30 NOTE — Telephone Encounter (Signed)
Patient left a voicemail stating that she sees on mychart that a refill was sent in for her Valtrex. Patient stated that she has had 3 pharmacies listed on her records and she has checked [facility] and [facility] and no one has the script. Patient requested a call back letting her know what pharmacy the script was sent to.

## 2019-05-08 ENCOUNTER — Encounter: Payer: Self-pay | Admitting: Internal Medicine

## 2019-05-08 DIAGNOSIS — R519 Headache, unspecified: Secondary | ICD-10-CM

## 2019-05-27 ENCOUNTER — Ambulatory Visit
Admission: RE | Admit: 2019-05-27 | Discharge: 2019-05-27 | Disposition: A | Discharge status: Home / Self Care | Payer: BC Managed Care – PPO | Source: Ambulatory Visit | Attending: Internal Medicine | Admitting: Internal Medicine

## 2019-05-27 DIAGNOSIS — R519 Headache, unspecified: Secondary | ICD-10-CM

## 2019-05-27 DIAGNOSIS — G4452 New daily persistent headache (NDPH): Secondary | ICD-10-CM

## 2019-06-26 ENCOUNTER — Encounter: Payer: Self-pay | Admitting: Internal Medicine

## 2019-07-08 ENCOUNTER — Telehealth: Payer: Self-pay | Admitting: *Deleted

## 2019-07-08 NOTE — Telephone Encounter (Signed)
Pt came to office but does not have appt until 07/09/19; pt hand does not appear very swollen; does appear a blister was on index finger and burst; pt has some tingling in hand.no difficulty breathing, no swelling in face, throat, tongue or lips. No available appt today. Pt scheduled an earlier appt on 07/09/19 at 10:30 with Avie Echevaria NP. Advised pt can use ice on hand prn and Benadryl 50 mg po q8h prn per instructions earlier from Avie Echevaria NP to [facility]. ED & UC precautions given and pt voiced understanding. FYI to Avie Echevaria NP.

## 2019-07-08 NOTE — Telephone Encounter (Signed)
Patient left a voicemail stating that she got stung by a bee Saturday and her hand is super swollen today. Patient stated that she has tried all of the home remedies but they have not helped. Patient stated that her hand is still swollen. Called patient back and was advised that she had called the office back and was able to get an appointment this afternoon with Dr. Glori Bickers.

## 2019-07-09 ENCOUNTER — Encounter: Payer: Self-pay | Admitting: Internal Medicine

## 2019-07-09 ENCOUNTER — Ambulatory Visit: Payer: BC Managed Care – PPO | Admitting: Family Medicine

## 2019-07-09 ENCOUNTER — Other Ambulatory Visit: Payer: Self-pay

## 2019-07-09 ENCOUNTER — Ambulatory Visit: Payer: BC Managed Care – PPO | Admitting: Internal Medicine

## 2019-07-09 VITALS — BP 148/102 | HR 79 | Temp 97.9°F | Wt 184.8 lb

## 2019-07-09 DIAGNOSIS — F419 Anxiety disorder, unspecified: Secondary | ICD-10-CM

## 2019-07-09 DIAGNOSIS — T63441A Toxic effect of venom of bees, accidental (unintentional), initial encounter: Secondary | ICD-10-CM

## 2019-07-09 MED ORDER — HYDROXYZINE HCL 10 MG PO TABS: 10.0000 mg | ORAL_TABLET | Freq: Two times a day (BID) | ORAL | 0 refills | Status: DC | PRN

## 2019-07-09 NOTE — Progress Notes (Signed)
Subjective:    Patient ID: Megan Mcdonald, female    DOB: 1961/01/29, 58 y.o.   MRN: [id number]  HPI  Pt presents to the clinic today with c/o finger pain, redness and swelling. This started Saturday after she was stung by a bee. She reports she pulled the stinger out but then a blister popped up. The blister popped and drained clear fluid. She reports some redness of the finger. The pain and swelling have improved with Benadryl. She denied any swelling of the lips, tongue or throat.  She also reports worsening anxiety. 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 She is concerned about going back with COVID 19. She reports this is intermittent, not daily. She has taken Xanax and Valium in the past with good relief of symptoms and would like to know if she could get a RX for this to take as needed. She denies depression, SI/HI.  Review of Systems      Past Medical History:  Diagnosis Date  . Abnormal mammogram of left breast 10/13/2017  . Anxiety   . Depression   . Headache   . Left breast mass     Current Outpatient Medications  Medication Sig Dispense Refill  . estradiol (VIVELLE-DOT) 0.1 MG/24HR patch PLACE 1 NEW PATCH ON THE SKIN TWICE A WEEK 8 patch 0  . valACYclovir (VALTREX) 500 MG tablet Take 1 tab BID x 3 days if you have an outbreak 30 tablet 2   No current facility-administered medications for this visit.     No Known Allergies  Family History  Problem Relation Age of Onset  . Alcohol abuse Mother   . Heart disease Father   . Cancer Neg Hx   . Diabetes Neg Hx   . Stroke Neg Hx     Social History   Socioeconomic History  . Marital status: Married    Spouse name: Not on file  . Number of children: Not on file  . Years of education: Not on file  . Highest education level: Not on file  Occupational History  . Occupation: Pharmacist, hospital    Comment: Proofreader middle  Social Needs  . Financial resource strain: Not on file  . Food insecurity   Worry: Not on file    Inability: Not on file  . Transportation needs    Medical: Not on file    Non-medical: Not on file  Tobacco Use  . Smoking status: Never Smoker  . Smokeless tobacco: Never Used  Substance and Sexual Activity  . Alcohol use: No  . Drug use: No  . Sexual activity: Not Currently  Lifestyle  . Physical activity    Days per week: Not on file    Minutes per session: Not on file  . Stress: Not on file  Relationships  . Social Herbalist on phone: Not on file    Gets together: Not on file    Attends religious service: Not on file    Active member of club or organization: Not on file    Attends meetings of clubs or organizations: Not on file    Relationship status: Not on file  . Intimate partner violence    Fear of current or ex partner: Not on file    Emotionally abused: Not on file    Physically abused: Not on file    Forced sexual activity: Not on file  Other Topics Concern  . Not on file  Social  History Narrative  . Not on file     Constitutional: Denies fever, malaise, fatigue, headache or abrupt weight changes.  Respiratory: Denies difficulty breathing, shortness of breath, cough or sputum production.   Cardiovascular: Denies chest pain, chest tightness, palpitations or swelling in the hands or feet.  Musculoskeletal: Denies decrease in range of motion, difficulty with gait, muscle pain or joint pain and swelling.  Skin: Pt reports redness, swelling of finger. Denies rashes or ulcercations.  Psych: Pt reports anxiety. Denies  depression, SI/HI.  No other specific complaints in a complete review of systems (except as listed in HPI above).  Objective:   Physical Exam  BP (!) 148/102   Pulse 79   Temp 97.9 F (36.6 C) (Temporal)   Wt 184 lb 12.8 oz (83.8 kg)   SpO2 98%   BMI 28.94 kg/m  Wt Readings from Last 3 Encounters:  07/09/19 184 lb 12.8 oz (83.8 kg)  12/10/18 183 lb (83 kg)  09/26/18 183 lb (83 kg)    General: Appears  her  stated age, well developed, well nourished in NAD. Skin: Redness noted along medial aspect, left index finger. No swelling, warmth, drainage noted. Cardiovascular: Normal rate and rhythm.  Pulmonary/Chest: Normal effort and positive vesicular breath sounds. No respiratory distress. No wheezes, rales or ronchi noted.  Musculoskeletal: Decreased flexion of the left index finger. Normal extension. No joint swelling. No pain with palpation. Neurological: Alert and oriented. Sensation intact to BLE.  Psychiatric: Anxious appearing, fidgety.  BMET    Component Value Date/Time   NA 141 04/02/2019 0848   K 3.8 04/02/2019 0848   CL 106 04/02/2019 0848   CO2 26 04/02/2019 0848   GLUCOSE 86 04/02/2019 0848   BUN 18 04/02/2019 0848   CREATININE 0.72 04/02/2019 0848   CALCIUM 9.4 04/02/2019 0848    Lipid Panel     Component Value Date/Time   CHOL 258 (H) 04/02/2019 0848   TRIG 75.0 04/02/2019 0848   HDL 59.90 04/02/2019 0848   CHOLHDL 4 04/02/2019 0848   VLDL 15.0 04/02/2019 0848   LDLCALC 184 (H) 04/02/2019 0848    CBC    Component Value Date/Time   WBC 6.7 04/02/2019 0848   RBC 4.85 04/02/2019 0848   HGB 14.5 04/02/2019 0848   HCT 42.6 04/02/2019 0848   PLT 262.0 04/02/2019 0848   MCV 87.7 04/02/2019 0848   MCHC 34.0 04/02/2019 0848   RDW 13.4 04/02/2019 0848    Hgb A1C No results found for: HGBA1C          Assessment & Plan:   Bee Sting, Left Index Finger:  Local reaction only No s/s of infection or cellulitis Apply Neosporin daily Allow to dry out, keeping it moist and covered will cause maceration and peeling of skin Ok to continue Benadryl 25-50 Q8H prn  Anxiety:  Deteriorated Support offered today RX for Hydroxyzine 10 mg BID prn- sedation caution given  Return precautions discussed [name], NP

## 2019-07-09 NOTE — Telephone Encounter (Signed)
Will see at upcoming appt 

## 2019-07-09 NOTE — Patient Instructions (Signed)

## 2019-07-31 ENCOUNTER — Other Ambulatory Visit: Payer: Self-pay | Admitting: Internal Medicine

## 2019-11-05 ENCOUNTER — Other Ambulatory Visit: Payer: Self-pay

## 2019-11-05 ENCOUNTER — Encounter: Payer: Self-pay | Admitting: Internal Medicine

## 2019-11-05 ENCOUNTER — Ambulatory Visit (INDEPENDENT_AMBULATORY_CARE_PROVIDER_SITE_OTHER): Payer: BC Managed Care – PPO | Admitting: Internal Medicine

## 2019-11-05 VITALS — BP 128/70 | HR 70 | Temp 98.0°F | Ht 64.5 in | Wt 184.0 lb

## 2019-11-05 DIAGNOSIS — F419 Anxiety disorder, unspecified: Secondary | ICD-10-CM

## 2019-11-05 DIAGNOSIS — A6004 Herpesviral vulvovaginitis: Secondary | ICD-10-CM | POA: Diagnosis not present

## 2019-11-05 DIAGNOSIS — Z0000 Encounter for general adult medical examination without abnormal findings: Secondary | ICD-10-CM

## 2019-11-05 DIAGNOSIS — Z780 Asymptomatic menopausal state: Secondary | ICD-10-CM | POA: Diagnosis not present

## 2019-11-05 DIAGNOSIS — A6000 Herpesviral infection of urogenital system, unspecified: Secondary | ICD-10-CM | POA: Insufficient documentation

## 2019-11-05 MED ORDER — ESTRADIOL 0.1 MG/24HR TD PTTW: MEDICATED_PATCH | TRANSDERMAL | 11 refills | Status: DC

## 2019-11-05 NOTE — Progress Notes (Signed)
Subjective:    Patient ID: Megan Mcdonald, female    DOB: [birth year], 59 y.o.   MRN: [id number]  HPI  Pt presents to the clinic today for her annual exam. She is also due to follow up chronic conditions.  Anxiety: Situational. Managed on Effexor and Hydroxyzine PRN. She is not seeing a therapist. She denies depression, SI/HI.  Menopausal Symptoms: Mainly hot flashes and night sweats. Managed on Effexor and Estradiol. She would like to get refill for Estradiol patches.   Herpes: No new outbreaks or lesions. Takes Valtrex PRN for outbreak  Flu: 08/2019 Tetanus: 09/2016 Pap Smear: 2013, hysterectomy Mammogram: 10/2017 Colon Screening: never Vision Screening: Annually Dentist: Annually  Diet: She eats meats. She consumes fruits and veggies daily. She eats fried foods. She mostly drinks water or hot tea.  Exercise: none    Review of Systems      Past Medical History:  Diagnosis Date  . Abnormal mammogram of left breast 10/13/2017  . Anxiety   . Depression   . Headache   . Left breast mass     Current Outpatient Medications  Medication Sig Dispense Refill  . estradiol (VIVELLE-DOT) 0.1 MG/24HR patch PLACE 1 NEW PATCH ON THE SKIN TWICE A WEEK 8 patch 0  . hydrOXYzine (ATARAX/VISTARIL) 10 MG tablet Take 1 tablet (10 mg total) by mouth 2 (two) times daily as needed. 60 tablet 0  . valACYclovir (VALTREX) 500 MG tablet Take 1 tab BID x 3 days if you have an outbreak 30 tablet 2   No current facility-administered medications for this visit.     No Known Allergies  Family History  Problem Relation Age of Onset  . Alcohol abuse Mother   . Heart disease Father   . Cancer Neg Hx   . Diabetes Neg Hx   . Stroke Neg Hx     Social History   Socioeconomic History  . Marital status: Married    Spouse name: Not on file  . Number of children: Not on file  . Years of education: Not on file  . Highest education level: Not on file  Occupational History  . Occupation: Pharmacist, hospital     Comment: Proofreader middle  Social Needs  . Financial resource strain: Not on file  . Food insecurity    Worry: Not on file    Inability: Not on file  . Transportation needs    Medical: Not on file    Non-medical: Not on file  Tobacco Use  . Smoking status: Never Smoker  . Smokeless tobacco: Never Used  Substance and Sexual Activity  . Alcohol use: No  . Drug use: No  . Sexual activity: Not Currently  Lifestyle  . Physical activity    Days per week: Not on file    Minutes per session: Not on file  . Stress: Not on file  Relationships  . Social Herbalist on phone: Not on file    Gets together: Not on file    Attends religious service: Not on file    Active member of club or organization: Not on file    Attends meetings of clubs or organizations: Not on file    Relationship status: Not on file  . Intimate partner violence    Fear of current or ex partner: Not on file    Emotionally abused: Not on file    Physically abused: Not on file    Forced sexual activity: Not on file  Other  Topics Concern  . Not on file  Social History Narrative  . Not on file     Constitutional: Denies fever, malaise, fatigue, headache or abrupt weight changes.  HEENT: Denies eye pain, eye redness, ear pain, ringing in the ears, wax buildup, runny nose, nasal congestion, bloody nose, or sore throat. Respiratory: Denies difficulty breathing, shortness of breath, cough or sputum production.   Cardiovascular: Denies chest pain, chest tightness, palpitations or swelling in the hands or feet.  Gastrointestinal: Denies abdominal pain, bloating, constipation, diarrhea or blood in the stool.  GU: Denies urgency, frequency, pain with urination, burning sensation, blood in urine, odor or discharge. Musculoskeletal: Denies decrease in range of motion, difficulty with gait, muscle pain or joint pain and swelling.  Skin: Denies redness, rashes, lesions or ulcercations.  Neurological: Pt  reports hot flashes. Denies dizziness, difficulty with memory, difficulty with speech or problems with balance and coordination.  Psych: Pt has a history of anxiety. Denies  depression, SI/HI.  No other specific complaints in a complete review of systems (except as listed in HPI above).  Objective:   Physical Exam   BP 128/70   Pulse 70   Temp 98 F (36.7 C) (Temporal)   Ht 5' 4.5" (1.638 m)   Wt 184 lb (83.5 kg)   SpO2 98%   BMI 31.10 kg/m   Wt Readings from Last 3 Encounters:  07/09/19 184 lb 12.8 oz (83.8 kg)  12/10/18 183 lb (83 kg)  09/26/18 183 lb (83 kg)    General: Appears her stated age, well developed in NAD. Skin: Warm, dry and intact. No rashes, lesions or ulcerations noted. HEENT: Head: normal shape and size; Eyes: sclera white, no icterus, conjunctiva pink and EOMs intact Neck:  Neck supple, trachea midline. No masses, lumps or thyromegaly present.  Cardiovascular: Normal rate and rhythm. S1,S2 noted.  No murmur, rubs or gallops noted. No JVD or BLE edema. No carotid bruits noted. Pulmonary/Chest: Normal effort and positive vesicular breath sounds. No respiratory distress. No wheezes, rales or ronchi noted.  Abdomen: Soft and nontender. Normal bowel sounds. No distention or masses noted. Liver, spleen and kidneys non palpable. Musculoskeletal: Strength 5/5 BUE/BLE. No signs of joint swelling. No difficulty with gait.  Neurological: Alert and oriented. Cranial nerves II-XII grossly intact. Coordination normal.  Psychiatric: Mood and affect normal. Behavior is normal. Judgment and thought content normal.     BMET    Component Value Date/Time   NA 141 04/02/2019 0848   K 3.8 04/02/2019 0848   CL 106 04/02/2019 0848   CO2 26 04/02/2019 0848   GLUCOSE 86 04/02/2019 0848   BUN 18 04/02/2019 0848   CREATININE 0.72 04/02/2019 0848   CALCIUM 9.4 04/02/2019 0848    Lipid Panel     Component Value Date/Time   CHOL 258 (H) 04/02/2019 0848   TRIG 75.0  04/02/2019 0848   HDL 59.90 04/02/2019 0848   CHOLHDL 4 04/02/2019 0848   VLDL 15.0 04/02/2019 0848   LDLCALC 184 (H) 04/02/2019 0848    CBC    Component Value Date/Time   WBC 6.7 04/02/2019 0848   RBC 4.85 04/02/2019 0848   HGB 14.5 04/02/2019 0848   HCT 42.6 04/02/2019 0848   PLT 262.0 04/02/2019 0848   MCV 87.7 04/02/2019 0848   MCHC 34.0 04/02/2019 0848   RDW 13.4 04/02/2019 0848    Hgb A1C No results found for: HGBA1C         Assessment & Plan:  Preventative Health  Maintenance:  She declines flu shot today Tetanus UTD She no longer needs pap smears She will call to schedule her mammogram once COVID has died down She declines referral for colonscopy, Cologuard or IFOB Encouraged to consume a healthy and balanced diet Ordered CBC, CMP, Lipid profile and Vit D  RTC in 1 year, sooner if needed [name], NP This visit occurred during the SARS-CoV-2 public health emergency.  Safety protocols were in place, including screening questions prior to the visit, additional usage of staff PPE, and extensive cleaning of exam room while observing appropriate contact time as indicated for disinfecting solutions.

## 2019-11-05 NOTE — Assessment & Plan Note (Signed)
Continue Effexor and Estradiol I would like her to come off the Estradiol at some point Will continue to monitor

## 2019-11-05 NOTE — Assessment & Plan Note (Addendum)
Continue Valtrex prn

## 2019-11-05 NOTE — Patient Instructions (Signed)

## 2019-11-05 NOTE — Assessment & Plan Note (Signed)
Continue Effexor and Hydroxyzine Support offered today Will monitor

## 2019-11-06 ENCOUNTER — Encounter: Payer: Self-pay | Admitting: Internal Medicine

## 2019-11-06 LAB — CBC
HCT: 41.9 % (ref 36.0–46.0)
Hemoglobin: 13.8 g/dL (ref 12.0–15.0)
MCHC: 32.9 g/dL (ref 30.0–36.0)
MCV: 88.8 fl (ref 78.0–100.0)
Platelets: 273 10*3/uL (ref 150.0–400.0)
RBC: 4.72 Mil/uL (ref 3.87–5.11)
RDW: 12.8 % (ref 11.5–15.5)
WBC: 8.1 10*3/uL (ref 4.0–10.5)

## 2019-11-06 LAB — COMPREHENSIVE METABOLIC PANEL
ALT: 27 U/L (ref 0–35)
AST: 24 U/L (ref 0–37)
Albumin: 3.9 g/dL (ref 3.5–5.2)
Alkaline Phosphatase: 87 U/L (ref 39–117)
BUN: 17 mg/dL (ref 6–23)
CO2: 28 mEq/L (ref 19–32)
Calcium: 9.3 mg/dL (ref 8.4–10.5)
Chloride: 105 mEq/L (ref 96–112)
Creatinine, Ser: 0.81 mg/dL (ref 0.40–1.20)
GFR: 72.24 mL/min (ref >60.00)
Glucose, Bld: 80 mg/dL (ref 70–99)
Potassium: 3.5 mEq/L (ref 3.5–5.1)
Sodium: 141 mEq/L (ref 135–145)
Total Bilirubin: 0.2 mg/dL (ref 0.2–1.2)
Total Protein: 7.3 g/dL (ref 6.0–8.3)

## 2019-11-06 LAB — LIPID PANEL
Cholesterol: 227 mg/dL — ABNORMAL HIGH (ref 0–200)
HDL: 59.4 mg/dL (ref >39.00)
LDL Cholesterol: 149 mg/dL — ABNORMAL HIGH (ref 0–99)
NonHDL: 167.55
Total CHOL/HDL Ratio: 4
Triglycerides: 92 mg/dL (ref 0.0–149.0)
VLDL: 18.4 mg/dL (ref 0.0–40.0)

## 2019-11-06 LAB — VITAMIN D 25 HYDROXY (VIT D DEFICIENCY, FRACTURES): VITD: 26.1 ng/mL — ABNORMAL LOW (ref 30.00–100.00)

## 2019-11-25 ENCOUNTER — Encounter: Payer: Self-pay | Admitting: Internal Medicine

## 2020-02-08 ENCOUNTER — Ambulatory Visit: Payer: BC Managed Care – PPO | Attending: Internal Medicine

## 2020-02-08 DIAGNOSIS — Z23 Encounter for immunization: Secondary | ICD-10-CM | POA: Insufficient documentation

## 2020-02-08 NOTE — Progress Notes (Signed)
   Covid-19 Vaccination Clinic  Name:  Megan Mcdonald    MRN: [id number] DOB: May 31, 1961  02/08/2020  [name] was observed post Covid-19 immunization for 15 minutes without incidence. She was provided with Vaccine Information Sheet and instruction to access the V-Safe system.   [name] was instructed to call 911 with any severe reactions post vaccine: Marland Kitchen Difficulty breathing  . Swelling of your face and throat  . A fast heartbeat  . A bad rash all over your body  . Dizziness and weakness    Immunizations Administered    Name Date Dose VIS Date Route   Pfizer COVID-19 Vaccine 02/08/2020  2:45 PM 0.3 mL 11/22/2019 Intramuscular   Manufacturer: [place]   Lot: [id number]   [place]: [id number]

## 2020-02-29 ENCOUNTER — Ambulatory Visit: Payer: BC Managed Care – PPO | Attending: Internal Medicine

## 2020-02-29 DIAGNOSIS — Z23 Encounter for immunization: Secondary | ICD-10-CM

## 2020-02-29 NOTE — Progress Notes (Signed)
   Covid-19 Vaccination Clinic  Name:  Megan Mcdonald    MRN: [id number] DOB: 16-Sep-1961  02/29/2020  [name] was observed post Covid-19 immunization for 15 minutes without incident. She was provided with Vaccine Information Sheet and instruction to access the V-Safe system.   [name] was instructed to call 911 with any severe reactions post vaccine: Marland Kitchen Difficulty breathing  . Swelling of face and throat  . A fast heartbeat  . A bad rash all over body  . Dizziness and weakness   Immunizations Administered    Name Date Dose VIS Date Route   Pfizer COVID-19 Vaccine 02/29/2020  8:18 AM 0.3 mL 11/22/2019 Intramuscular   Manufacturer: [place]   Lot: [id number]   [place]: [id number]

## 2020-03-25 ENCOUNTER — Encounter: Payer: Self-pay | Admitting: Internal Medicine

## 2020-03-25 MED ORDER — ESTRADIOL 0.1 MG/24HR TD PTTW: MEDICATED_PATCH | TRANSDERMAL | 5 refills | Status: DC

## 2020-05-28 ENCOUNTER — Emergency Department (HOSPITAL_COMMUNITY): Payer: BC Managed Care – PPO

## 2020-05-28 ENCOUNTER — Other Ambulatory Visit: Payer: Self-pay

## 2020-05-28 ENCOUNTER — Other Ambulatory Visit: Payer: Self-pay | Admitting: Internal Medicine

## 2020-05-28 ENCOUNTER — Telehealth: Payer: Self-pay

## 2020-05-28 ENCOUNTER — Emergency Department (HOSPITAL_COMMUNITY)
Admission: EM | Admit: 2020-05-28 | Discharge: 2020-05-28 | Disposition: A | Discharge status: Home / Self Care | Payer: BC Managed Care – PPO | Attending: Emergency Medicine | Admitting: Emergency Medicine

## 2020-05-28 ENCOUNTER — Encounter (HOSPITAL_COMMUNITY): Payer: Self-pay

## 2020-05-28 DIAGNOSIS — N10 Acute tubulo-interstitial nephritis: Secondary | ICD-10-CM | POA: Insufficient documentation

## 2020-05-28 DIAGNOSIS — R109 Unspecified abdominal pain: Secondary | ICD-10-CM | POA: Diagnosis present

## 2020-05-28 DIAGNOSIS — N12 Tubulo-interstitial nephritis, not specified as acute or chronic: Secondary | ICD-10-CM

## 2020-05-28 LAB — CBC WITH DIFFERENTIAL/PLATELET
Abs Immature Granulocytes: 0.01 10*3/uL (ref 0.00–0.07)
Basophils Absolute: 0 10*3/uL (ref 0.0–0.1)
Basophils Relative: 1 %
Eosinophils Absolute: 0.1 10*3/uL (ref 0.0–0.5)
Eosinophils Relative: 1 %
HCT: 42.4 % (ref 36.0–46.0)
Hemoglobin: 13.7 g/dL (ref 12.0–15.0)
Immature Granulocytes: 0 %
Lymphocytes Relative: 26 %
Lymphs Abs: 2.1 10*3/uL (ref 0.7–4.0)
MCH: 29 pg (ref 26.0–34.0)
MCHC: 32.3 g/dL (ref 30.0–36.0)
MCV: 89.6 fL (ref 80.0–100.0)
Monocytes Absolute: 0.6 10*3/uL (ref 0.1–1.0)
Monocytes Relative: 7 %
Neutro Abs: 5.3 10*3/uL (ref 1.7–7.7)
Neutrophils Relative %: 65 %
Platelets: 248 10*3/uL (ref 150–400)
RBC: 4.73 MIL/uL (ref 3.87–5.11)
RDW: 13 % (ref 11.5–15.5)
WBC: 8.1 10*3/uL (ref 4.0–10.5)
nRBC: 0 % (ref 0.0–0.2)

## 2020-05-28 LAB — URINALYSIS, ROUTINE W REFLEX MICROSCOPIC
Bilirubin Urine: NEGATIVE
Glucose, UA: NEGATIVE mg/dL
Hgb urine dipstick: NEGATIVE
Ketones, ur: NEGATIVE mg/dL
Nitrite: NEGATIVE
Protein, ur: NEGATIVE mg/dL
Specific Gravity, Urine: 1.014 (ref 1.005–1.030)
pH: 8 (ref 5.0–8.0)

## 2020-05-28 LAB — COMPREHENSIVE METABOLIC PANEL
ALT: 24 U/L (ref 0–44)
AST: 23 U/L (ref 15–41)
Albumin: 4.2 g/dL (ref 3.5–5.0)
Alkaline Phosphatase: 77 U/L (ref 38–126)
Anion gap: 8 (ref 5–15)
BUN: 15 mg/dL (ref 6–20)
CO2: 25 mmol/L (ref 22–32)
Calcium: 8.8 mg/dL — ABNORMAL LOW (ref 8.9–10.3)
Chloride: 106 mmol/L (ref 98–111)
Creatinine, Ser: 0.66 mg/dL (ref 0.44–1.00)
GFR calc Af Amer: >60 mL/min (ref >60)
GFR calc non Af Amer: >60 mL/min (ref >60)
Glucose, Bld: 86 mg/dL (ref 70–99)
Potassium: 3.9 mmol/L (ref 3.5–5.1)
Sodium: 139 mmol/L (ref 135–145)
Total Bilirubin: 0.5 mg/dL (ref 0.3–1.2)
Total Protein: 7.6 g/dL (ref 6.5–8.1)

## 2020-05-28 MED ORDER — MORPHINE SULFATE (PF) 4 MG/ML IV SOLN
4.0000 mg | Freq: Once | INTRAVENOUS | Status: AC
  Administered 2020-05-28: 4 mg via INTRAVENOUS
  Filled 2020-05-28: qty 1

## 2020-05-28 MED ORDER — IOHEXOL 300 MG/ML  SOLN
100.0000 mL | Freq: Once | INTRAMUSCULAR | Status: AC | PRN
  Administered 2020-05-28: 100 mL via INTRAVENOUS

## 2020-05-28 MED ORDER — CEPHALEXIN 500 MG PO CAPS
500.0000 mg | ORAL_CAPSULE | Freq: Four times a day (QID) | ORAL | 0 refills | Status: DC
Start: 2020-05-28 — End: 2021-08-13

## 2020-05-28 MED ORDER — OXYCODONE-ACETAMINOPHEN 5-325 MG PO TABS: 1.0000 | ORAL_TABLET | Freq: Four times a day (QID) | ORAL | 0 refills | Status: DC | PRN

## 2020-05-28 MED ORDER — OXYCODONE-ACETAMINOPHEN 5-325 MG PO TABS
1.0000 | ORAL_TABLET | Freq: Once | ORAL | Status: AC
  Administered 2020-05-28: 1 via ORAL
  Filled 2020-05-28: qty 1

## 2020-05-28 MED ORDER — SODIUM CHLORIDE 0.9 % IV SOLN
1.0000 g | Freq: Once | INTRAVENOUS | Status: AC
  Administered 2020-05-28: 1 g via INTRAVENOUS
  Filled 2020-05-28: qty 10

## 2020-05-28 MED ORDER — SODIUM CHLORIDE (PF) 0.9 % IJ SOLN
INTRAMUSCULAR | Status: AC
  Filled 2020-05-28: qty 50

## 2020-05-28 MED ORDER — ONDANSETRON HCL 4 MG/2ML IJ SOLN
4.0000 mg | Freq: Once | INTRAMUSCULAR | Status: AC
  Administered 2020-05-28: 4 mg via INTRAVENOUS
  Filled 2020-05-28: qty 2

## 2020-05-28 MED ORDER — SODIUM CHLORIDE 0.9 % IV BOLUS
1000.0000 mL | Freq: Once | INTRAVENOUS | Status: AC
  Administered 2020-05-28: 1000 mL via INTRAVENOUS

## 2020-05-28 NOTE — ED Provider Notes (Signed)
[facility] DEPT Provider Note   CSN: [id number] Arrival date & time: 05/28/20  9892     History Chief Complaint  Patient presents with  . Flank Pain    Megan Mcdonald is a 59 y.o. female.  She is presenting with a complaint of right flank pain that started around 8 PM last evening and worsened throughout the night.  No known trauma.  No prior history of similar pain.  Not associate with any fevers or chills nausea vomiting or urinary symptoms.  Has tried nothing for it.  Rates the pain as 8 out of 10 currently 10 out of 10 earlier.  The history is provided by the patient.  Flank Pain This is a new problem. The current episode started 12 to 24 hours ago. The problem occurs constantly. The problem has not changed since onset.Pertinent negatives include no chest pain, no abdominal pain, no headaches and no shortness of breath. Exacerbated by: laying down. Nothing relieves the symptoms. She has tried nothing for the symptoms. The treatment provided no relief.       Past Medical History:  Diagnosis Date  . Abnormal mammogram of left breast 10/13/2017  . Anxiety   . Depression   . Headache   . Left breast mass     Patient Active Problem List   Diagnosis Date Noted  . Genital herpes 11/05/2019  . Anxiety 07/09/2019  . Postmenopausal 07/10/2014    Past Surgical History:  Procedure Laterality Date  . ABDOMINAL HYSTERECTOMY  1990   Partial  . BREAST LUMPECTOMY WITH RADIOACTIVE SEED LOCALIZATION Left 10/13/2017   Procedure: LEFT BREAST LUMPECTOMY WITH RADIOACTIVE SEED LOCALIZATION;  Surgeon: Fanny Skates, MD;  Location: [facility];  Service: General;  Laterality: Left;  . TUBAL LIGATION       OB History   No obstetric history on file.     Family History  Problem Relation Age of Onset  . Alcohol abuse Mother   . Heart disease Father   . Cancer Neg Hx   . Diabetes Neg Hx   . Stroke Neg Hx     Social History   Tobacco Use   . Smoking status: Never Smoker  . Smokeless tobacco: Never Used  Substance Use Topics  . Alcohol use: No  . Drug use: No    Home Medications Prior to Admission medications   Medication Sig Start Date End Date Taking? Authorizing Provider  diphenhydrAMINE (BENADRYL) 25 MG tablet Take 25 mg by mouth as needed.     [provider]  estradiol (VIVELLE-DOT) 0.1 MG/24HR patch PLACE 1 NEW PATCH ON THE SKIN TWICE A WEEK 03/25/20   Jearld Fenton, NP  hydrOXYzine (ATARAX/VISTARIL) 10 MG tablet Take 1 tablet (10 mg total) by mouth 2 (two) times daily as needed. 07/09/19   Jearld Fenton, NP  valACYclovir (VALTREX) 500 MG tablet Take 1 tab BID x 3 days if you have an outbreak 04/01/19   Jearld Fenton, NP    Allergies    Patient has no known allergies.  Review of Systems   Review of Systems  Constitutional: Negative for fever.  HENT: Negative for sore throat.   Eyes: Negative for visual disturbance.  Respiratory: Negative for shortness of breath.   Cardiovascular: Negative for chest pain.  Gastrointestinal: Negative for abdominal pain.  Genitourinary: Positive for flank pain. Negative for dysuria.  Musculoskeletal: Negative for neck pain.  Skin: Negative for rash.  Neurological: Negative for headaches.  Physical Exam Updated Vital Signs BP (!) 146/87 (BP Location: Left Arm)   Pulse 68   Temp 98.8 F (37.1 C) (Oral)   Resp 20   SpO2 100%   Physical Exam Vitals and nursing note reviewed.  Constitutional:      General: She is not in acute distress.    Appearance: She is well-developed.  HENT:     Head: Normocephalic and atraumatic.  Eyes:     Conjunctiva/sclera: Conjunctivae normal.  Cardiovascular:     Rate and Rhythm: Normal rate and regular rhythm.     Heart sounds: No murmur heard.   Pulmonary:     Effort: Pulmonary effort is normal. No respiratory distress.     Breath sounds: Normal breath sounds.  Abdominal:     Palpations: Abdomen is soft.      Tenderness: There is no abdominal tenderness. There is right CVA tenderness. There is no left CVA tenderness, guarding or rebound.  Musculoskeletal:        General: No deformity or signs of injury. Normal range of motion.     Cervical back: Neck supple.  Skin:    General: Skin is warm and dry.     Capillary Refill: Capillary refill takes less than 2 seconds.  Neurological:     General: No focal deficit present.     Mental Status: She is alert.     Sensory: No sensory deficit.     Motor: No weakness.     Gait: Gait normal.     ED Results / Procedures / Treatments   Labs (all labs ordered are listed, but only abnormal results are displayed) Labs Reviewed  URINALYSIS, ROUTINE W REFLEX MICROSCOPIC - Abnormal; Notable for the following components:      Result Value   Leukocytes,Ua TRACE (*)    Bacteria, UA RARE (*)    All other components within normal limits  COMPREHENSIVE METABOLIC PANEL - Abnormal; Notable for the following components:   Calcium 8.8 (*)    All other components within normal limits  URINE CULTURE  CBC WITH DIFFERENTIAL/PLATELET    EKG None  Radiology CT Abdomen Pelvis W Contrast  Result Date: 05/28/2020 CLINICAL DATA:  Flank pain EXAM: CT ABDOMEN AND PELVIS WITH CONTRAST TECHNIQUE: Multidetector CT imaging of the abdomen and pelvis was performed using the standard protocol following bolus administration of intravenous contrast. CONTRAST:  145mL OMNIPAQUE IOHEXOL 300 MG/ML  SOLN COMPARISON:  Same-day CT FINDINGS: Lower chest: No acute abnormality. Hepatobiliary: 1.5 cm cyst within the inferior aspect of the right hepatic lobe. The liver is otherwise unremarkable. Unremarkable appearance of the gallbladder. No hyperdense gallstone. No biliary dilatation. Pancreas: Unremarkable. No pancreatic ductal dilatation or surrounding inflammatory changes. Spleen: Normal in size without focal abnormality. Adrenals/Urinary Tract: Unremarkable adrenal glands. There are multiple  punctate nonobstructing right renal calculi, largest measuring 3 mm. A single tiny punctate left renal calculus is evident. 1.1 cm simple cyst within the superior pole of the left kidney. Kidneys enhance symmetrically. No hydronephrosis. Ureters are unremarkable. No ureteral calculi. Urinary bladder is within normal limits for the degree of distension. Stomach/Bowel: Stomach is within normal limits. Appendix appears normal. Minimal scattered colonic diverticulosis. No evidence of bowel wall thickening, distention, or inflammatory changes. Vascular/Lymphatic: Mild scattered atherosclerotic calcifications of the abdominal aorta without aneurysm. No abdominopelvic lymphadenopathy. Reproductive: Status post hysterectomy. No adnexal masses. Other: No free air.  No free fluid.  No abdominal wall hernia. Musculoskeletal: Degenerative disc disease of L4-5 and L5-S1. No acute osseous findings.  IMPRESSION: 1. No acute abdominopelvic findings. 2. Bilateral nonobstructing nephrolithiasis. 3. Minimal colonic diverticulosis without evidence for diverticulitis. 4. Aortic atherosclerosis. (ICD10-I70.0). Electronically Signed   By: Davina Poke D.O.   On: 05/28/2020 16:12   CT Renal Stone Study  Result Date: 05/28/2020 CLINICAL DATA:  Right flank pain and right back pain. Possible kidney stone. EXAM: CT ABDOMEN AND PELVIS WITHOUT CONTRAST TECHNIQUE: Multidetector CT imaging of the abdomen and pelvis was performed following the standard protocol without IV contrast. COMPARISON:  None. FINDINGS: Lower chest: No acute findings. Hepatobiliary: 1.4 cm cyst over the right lobe of the liver. Gallbladder and biliary tree are normal. Pancreas: Normal. Spleen: Normal. Adrenals/Urinary Tract: Adrenal glands are normal. Kidneys are normal in size without hydronephrosis. There are several bilateral small nonobstructing renal stones present. Ureters and bladder are normal. Stomach/Bowel: Stomach and small bowel are normal. Appendix is  normal. Very minimal diverticulosis over the sigmoid colon as the colon is otherwise unremarkable. Vascular/Lymphatic: Mild calcified plaque over the abdominal aorta which is normal in caliber. No adenopathy. Reproductive: Previous hysterectomy. Other: No free fluid or focal inflammatory change. Musculoskeletal: Moderate disc disease at the L4-5 and L5-S1 levels. IMPRESSION: 1.  No acute findings in the abdomen/pelvis. 2. Bilateral small nonobstructing renal stones. No ureteral stones or obstruction. 3. 1.4 cm hypodensity over the right lobe of the liver likely a cyst. 4.  Minimal diverticulosis of the sigmoid colon. 5.  Aortic Atherosclerosis (ICD10-I70.0). 6.  Degenerative disc disease at the L4-5 and L5-S1 levels. Electronically Signed   By: Marin Olp M.D.   On: 05/28/2020 15:07    Procedures Procedures (including critical care time)  Medications Ordered in ED Medications  sodium chloride (PF) 0.9 % injection (has no administration in time range)  morphine 4 MG/ML injection 4 mg (4 mg Intravenous Given 05/28/20 1334)  ondansetron (ZOFRAN) injection 4 mg (4 mg Intravenous Given 05/28/20 1334)  sodium chloride 0.9 % bolus 1,000 mL (0 mLs Intravenous Stopped 05/28/20 1644)  cefTRIAXone (ROCEPHIN) 1 g in sodium chloride 0.9 % 100 mL IVPB (0 g Intravenous Stopped 05/28/20 1558)  morphine 4 MG/ML injection 4 mg (4 mg Intravenous Given 05/28/20 1525)  oxyCODONE-acetaminophen (PERCOCET/ROXICET) 5-325 MG per tablet 1 tablet (1 tablet Oral Given 05/28/20 1644)  iohexol (OMNIPAQUE) 300 MG/ML solution 100 mL (100 mLs Intravenous Contrast Given 05/28/20 1544)    ED Course  I have reviewed the triage vital signs and the nursing notes.  Pertinent labs & imaging results that were available during my care of the patient were reviewed by me and considered in my medical decision making (see chart for details).  Clinical Course as of May 28 2002  Thu May 28, 2020  1412 Reevaluation-patient states her pain is  somewhat improved.  Have ordered her to get some IV antibiotics and put her in for CT KUB.  Will redose some pain medicine.   [MB]  1520 Patient still having significant pain her CT imaging does not show anything obvious to account for it.  This could still be Pilo although would expect to see some stranding or something.  Will order a CT abdomen pelvis with contrast.   [MB]    Clinical Course User Index [MB] Hayden Rasmussen, MD   MDM Rules/Calculators/A&P                         This patient complains of right flank pain; this involves an extensive number of treatment Options and is  a complaint that carries with it a high risk of complications and Morbidity. The differential includes renal colic, pyelonephritis, cholecystitis, biliary colic, musculoskeletal, retroperitoneal bleed  I ordered, reviewed and interpreted labs, which included CBC showing a normal white count normal hemoglobin normal platelets.  Chemistries essentially normal including LFTs other than a mildly low calcium of 8.8.  Urinalysis concerning for infection with 21-50 whites, rare bacteria 0-5 red cells nitrite negative I ordered medication IV pain medication and antibiotics along with fluids. I ordered imaging studies which included CT KUB and CT abdomen pelvis with IV contrast and I independently    visualized and interpreted imaging which showed no acute findings Previous records obtained and reviewed in epic - none  After the interventions stated above, I reevaluated the patient and found patient's pain to be somewhat improved.  She still very frustrated that not a clear diagnosis has been given.  I think her symptoms most likely are related to Pilo although has no urinary symptoms.  No evidence of intraureteral stone or perinephric abscess or renal infarct.  Will treat with antibiotics and pain medication with close follow-up.  Return instructions discussed.   Final Clinical Impression(s) / ED Diagnoses Final  diagnoses:  Right flank pain  Pyelonephritis    Rx / DC Orders ED Discharge Orders    None       Hayden Rasmussen, MD 05/28/20 2005

## 2020-05-28 NOTE — Discharge Instructions (Signed)
You were seen in the emergency department for evaluation of right flank pain.  Your urine showed signs of infection and this may be a kidney infection.  You had a CAT scan that did not show an obvious kidney stone or other explanation for your symptoms.  We are sending home with prescriptions for pain medicine and antibiotics to take.  Please follow-up with your regular doctor.  Return to the emergency department if any worsening or concerning symptoms.

## 2020-05-28 NOTE — ED Triage Notes (Signed)
Arrived POV from home. Patient reports right flank pain that started around 8PM last night and has gotten worse. Patient says she has been up since 2 AM with the pain.

## 2020-05-28 NOTE — Telephone Encounter (Signed)
Agree with advice given

## 2020-05-28 NOTE — Telephone Encounter (Signed)
Pt said she had been waiting 4 1/2 hrs at ED. Pt thinks she is passing a kidney stone. No hx of kidney stones.pt has rt sided lower back pain with pain level of 10. Pain is sharp and continuous since this morning at 2 AM when pt was awoken with pain. Pt is nauseated at times due to pain. Pt wants to know if we can see pt sooner or give pt med. Advised pt she does need to stay at ED for eval and testing and can update ED on intensity of pain. Pt voiced understanding and appreciative.  Avie Echevaria NP agreed pt needs to stay and be seen at ED. FYI to Avie Echevaria NP.

## 2020-05-31 LAB — URINE CULTURE: Culture: 70000 — AB

## 2020-06-01 ENCOUNTER — Telehealth: Payer: Self-pay

## 2020-06-01 ENCOUNTER — Encounter: Payer: Self-pay | Admitting: Internal Medicine

## 2020-06-01 NOTE — Telephone Encounter (Signed)
Post ED Visit - Positive Culture Follow-up  Culture report reviewed by antimicrobial stewardship pharmacist: [facility] Team []  Elenor Quinones, Pharm.D. []  Heide Guile, Pharm.D., BCPS AQ-ID []  Parks Neptune, Pharm.D., BCPS []  Alycia Rossetti, Pharm.D., BCPS []  [place], [place].D., BCPS, AAHIVP []  Legrand Como, Pharm.D., BCPS, AAHIVP []  Salome Arnt, PharmD, BCPS []  Johnnette Gourd, PharmD, BCPS []  Hughes Better, PharmD, BCPS []  Leeroy Cha, PharmD []  Laqueta Linden, PharmD, BCPS []  Albertina Parr, PharmD  [facility] Team []  Leodis Sias, PharmD [x]  Lindell Spar, PharmD []  Royetta Asal, PharmD []  Graylin Shiver, Rph []  Rema Fendt) Glennon Mac, PharmD []  Arlyn Dunning, PharmD []  Netta Cedars, PharmD []  Dia Sitter, PharmD []  Leone Haven, PharmD []  Gretta Arab, PharmD []  Theodis Shove, PharmD []  Peggyann Juba, PharmD []  Reuel Boom, PharmD   Positive urine culture Treated with Cephalexin, organism sensitive to the same and no further patient follow-up is required at this time.  Genia Del 06/01/2020, 11:47 AM

## 2020-06-05 ENCOUNTER — Ambulatory Visit: Payer: BC Managed Care – PPO | Admitting: Internal Medicine

## 2020-08-24 ENCOUNTER — Other Ambulatory Visit: Payer: Self-pay | Admitting: Internal Medicine

## 2020-10-27 ENCOUNTER — Encounter: Payer: Self-pay | Admitting: Internal Medicine

## 2020-12-01 ENCOUNTER — Encounter: Payer: Self-pay | Admitting: Internal Medicine

## 2020-12-01 NOTE — Telephone Encounter (Signed)
[facility] - Client TELEPHONE ADVICE RECORD [facility] Patient Name: Megan Mcdonald Gender: Female DOB: 02/29/1961 Age: 59 Y 49 M 29 D Return Phone Number: [phone] (Primary) Address: [address] City/State/Zip: [place] [address] Client [facility] - Client Client Site [facility] [place] - Day Contact Type Call Who Is Calling Patient / Member / Family / Caregiver Call Type Triage / Clinical Relationship To Patient Self Return Phone Number [phone] (Primary) Chief Complaint Cough Reason for Call Symptomatic / Request for [place] states that she has Covid. What should she do? Translation No Nurse Assessment Nurse: Lucky Cowboy, RN, Levada Dy Date/Time (Eastern Time): 12/01/2020 12:36:16 PM Confirm and document reason for call. If symptomatic, describe symptoms. ---Caller stated that she has dx with COVID, s/s started Wed. Does the patient have any new or worsening symptoms? ---Yes Will a triage be completed? ---Yes Related visit to physician within the last 2 weeks? ---No Does the PT have any chronic conditions? (i.e. diabetes, asthma, this includes High risk factors for pregnancy, etc.) ---No Is this a behavioral health or substance abuse call? ---No Guidelines Guideline Title Affirmed Question Affirmed Notes Nurse Date/Time ([place] Time) COVID-19 - Diagnosed or Suspected [1] COVID-19 diagnosed by positive lab test (e.g., PCR, rapid selftest kit) AND [2] mild symptoms (e.g., cough, fever, others) AND [1] no complications or SOB Dew, RN, Levada Dy 12/01/2020 12:37:37 PM Disp. Time Eilene Ghazi Time) Disposition Final User 12/01/2020 12:41:49 PM Home Care Yes Dew, RN, Marin Shutter Disagree/Comply Comply Caller Understands Yes PreDisposition Did not know what to do PLEASE NOTE: All timestamps contained within this report are represented as [place] Standard  Time. CONFIDENTIALTY NOTICE: This fax transmission is intended only for the addressee. It contains information that is legally privileged, confidential or otherwise protected from use or disclosure. If you are not the intended recipient, you are strictly prohibited from reviewing, disclosing, copying using or disseminating any of this information or taking any action in reliance on or regarding this information. If you have received this fax in error, please notify us immediately by telephone so that we can arrange for its return to [place]. Phone: [phone], Toll-Free: [phone], Fax: [phone] Page: 2 of 2 Call Id: [id number] Care Advice Given Per Guideline HOME CARE: * You should be able to treat this at home. REASSURANCE AND EDUCATION - POSITIVE COVID-19 LAB TEST AND MILD SYMPTOMS: * You had a recent lab test for COVID-19 and it came back positive. * A positive result on a PCR or rapid self-test kit is highly accurate for diagnosing COVID-19. It is highly likely that you have COVID-19. * From what you have told me, your symptoms are mild. That is reassuring. * Here's some care advice to help you and to help prevent others from getting sick. GENERAL CARE ADVICE FOR COVID-19 SYMPTOMS: * The treatment is the same whether you have COVID-19, influenza or some other respiratory virus. * Cough: Use cough drops. * Feeling dehydrated: Drink extra liquids. If the air in your home is dry, use a humidifier. * Fever: For fever over 101 F (38.3 C), take acetaminophen every 4 to 6 hours (Adults 650 mg) OR ibuprofen every 6 to 8 hours (Adults 400 mg). Before taking any medicine, read all the instructions on the package. Do not take aspirin unless your doctor has prescribed it for you. * Muscle aches, headache, and other pains: Often this comes and goes with the fever. Take  acetaminophen every 4 to 6 hours (Adults 650 mg) OR ibuprofen every 6 to 8 hours (Adults 400 mg). Before taking any medicine, read all  the instructions on the package. * Sore throat: Try throat lozenges, hard candy or warm chicken broth. AVOID TOBACCO SMOKE: * Avoid tobacco smoke. * Smoking or being exposed to smoke makes coughs much worse. HOW TO PROTECT OTHERS - WHEN YOU ARE SICK WITH COVID-19: * STAY HOME A MINIMUM OF 10 DAYS: Home isolation is needed for at least 10 days after the symptoms started. Stay home from school or work if you are sick. Do NOT go to religious services, child care centers, shopping, or other public places. Do NOT use public transportation (e.g., bus, taxis, ridesharing). Do NOT allow any visitors to your home. Leave the house only if you need to seek urgent medical care. * COVER THE COUGH: Cough and sneeze into your shirt sleeve or inner elbow. Don't cough into your hand or the air. If available, cough into a tissue and throw it into a trash can. * [place] HANDS OFTEN: Wash hands often with soap and water. After coughing or sneezing are important times. If soap and water are not available, use an alcohol-based hand sanitizer with at least 60% alcohol, covering all surfaces of your hands and rubbing them together until they feel dry. Avoid touching your eyes, nose, and mouth with unwashed hands. * WEAR A MASK: Wear a facemask when around others. Always wear a facemask (if available) if you have to leave your home (such as going to a medical facility). * CALL AHEAD IF MEDICAL CARE NEEDED: If you have a medical appointment, call your doctor's office and tell them you have or may have COVID-19. This will help the office protect themselves and other patients. They will give you directions. STOPPING HOME ISOLATION - MUST MEET ALL 3 REQUIREMENTS (CDC): * Fever gone for at least 24 hours off fever-reducing medicines AND * Cough and other symptoms must be improved AND * Symptoms started more than 10 days ago. * If unsure if it is safe for you to leave isolation, check the CDC website or call your doctor (or NP/ PA).  CALL BACK IF: * Fever over 103 F (39.4 C) * Fever lasts over 3 days * Fever returns after being gone for 24 hours * Chest pain or difficulty breathing occurs * You become worse CARE ADVICE given per COVID-19 - DIAGNOSED OR SUSPECTED (Adult) guideline.

## 2020-12-01 NOTE — Telephone Encounter (Signed)
I spoke with pt; symptoms started 11/25/20; no fever now; prod cough with clear phlegm.pt has a scratchy irritated throat.   Pt has taken mucinex for the cough but not helping. No h/a, SOB, diarrhea, no loss of taste or smell, no vomiting and no runny nose now. Pt got + PCR covid test 12/01/20. [facility]. No distress with breathing; pt said main issue bothering pt now is the cough. Pt is drinking hot tea and a lot of water to try to get rid of cough. Pt request cb after reviewed by Avie Echevaria NP.

## 2020-12-03 ENCOUNTER — Encounter: Payer: Self-pay | Admitting: Internal Medicine

## 2020-12-07 ENCOUNTER — Telehealth: Payer: Self-pay

## 2020-12-07 NOTE — Telephone Encounter (Signed)
[facility] - Client TELEPHONE ADVICE RECORD [facility] Patient Name: Megan Mcdonald Gender: Female DOB: 05-11-1961 Age: 59 Y 7 M 10 D Return Phone Number: [phone] (Primary) Address: [address] City/State/Zip: [place] [address] Client [facility] - Client Client Site [facility] - Day Physician Nicki Reaper - NP Contact Type Call Who Is Calling Patient / Member / Family / Caregiver Call Type Triage / Clinical Relationship To Patient Self Return Phone Number [phone] (Primary) Chief Complaint Cold Symptom Reason for Call Symptomatic / Request for Health Information Initial Comment CC: 3:38 Attempt made-no message left Caller states she tested positive for COVID and lost her taste and smell. Translation No No Triage Reason Patient declined Nurse Assessment Nurse: Mayford Knife, RN, Lupe Carney Date/Time Lamount Cohen Time): 12/03/2020 3:47:06 PM Confirm and document reason for call. If symptomatic, describe symptoms. ---Caller states she tested positive for COVID and lost her taste and smell. Does have a cough, that's improved. Does the patient have any new or worsening symptoms? ---Yes Will a triage be completed? ---No Select reason for no triage. ---Patient declined Please document clinical information provided and list any resource used. ---While attempting to triage pt became upset when nurse said she would ask her questions about her symptoms. Stated she didn't want to go through this again and shouldn't have even called. Stated she doesn't understand any of this. Ended call. Disp. Time Lamount Cohen Time) Disposition Final User 12/03/2020 3:38:33 PM Attempt made - no message left Jiles Garter 12/03/2020 3:38:49 PM Send To Clinical Follow Up Sabino Donovan Jiles Garter 12/03/2020 3:51:03 PM Clinical Call Yes Mayford Knife, RN, Lupe Carney Comments User: Hadley Pen, RN Date/Time Lamount Cohen  Time): 12/03/2020 3:51:18 PM While attempting to triage pt became upset when nurse said she would ask her questions about her symptoms. Stated she didn't want to go through this again and shouldn't have even called. Stated she doesn't understand any of this. Ended call  Called to get update on patient due to her recent call. Patient stated that she is doing the same and she is frustrated because she feels like nothing is being done for her. Patient stated, "I never get what I want from this clinic. 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 Patient replied, "No don't worry about it! I'm just going to find another doctor!" Informed patient that if she had new or worsening symptoms, she should go to UC or ED. Patient verbalized understanding.

## 2020-12-07 NOTE — Telephone Encounter (Signed)
Noted, agree that pt should find a new PCP. She was offered virtual appts many times, and she declined to schedule.

## 2020-12-09 ENCOUNTER — Telehealth: Payer: BC Managed Care – PPO | Admitting: Family Medicine

## 2021-02-01 ENCOUNTER — Telehealth: Payer: Self-pay | Admitting: Internal Medicine

## 2021-02-01 NOTE — Telephone Encounter (Signed)
Okay for transfer, next available new pt appt.

## 2021-02-01 NOTE — Telephone Encounter (Signed)
Pt called in wanted to know about TOC from NP Baity to Dr. Diona Browner

## 2021-02-01 NOTE — Telephone Encounter (Cosign Needed)
Fine with me

## 2021-02-05 ENCOUNTER — Ambulatory Visit: Payer: BC Managed Care – PPO | Admitting: Family Medicine

## 2021-02-13 IMAGING — CT CT RENAL STONE PROTOCOL
2 of 4 series · 16 of 46 positions shown, 18 images · non-contrast
Comparison: None.

CLINICAL DATA: Right flank pain and right back pain. Possible
kidney stone.

EXAM:
CT ABDOMEN AND PELVIS WITHOUT CONTRAST
TECHNIQUE: Multidetector CT imaging of the abdomen and pelvis was performed
following the standard protocol without IV contrast.

[Series 2: axial st · axial · 0.69mm/px · z∈[-464,-54]mm · 13 of 94 slices shown, 15 images]
[im 6/94  soft-tissue]
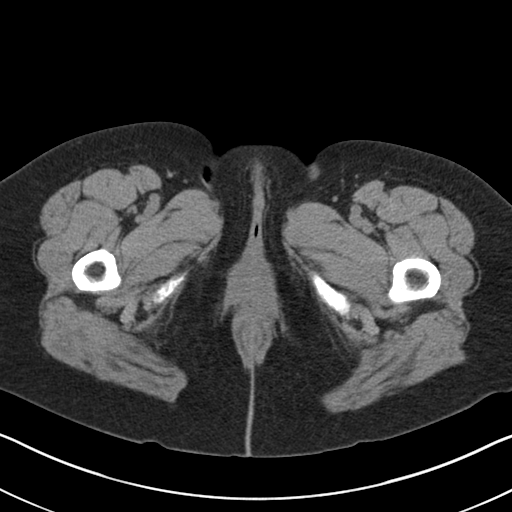
[im 6/94  bone]
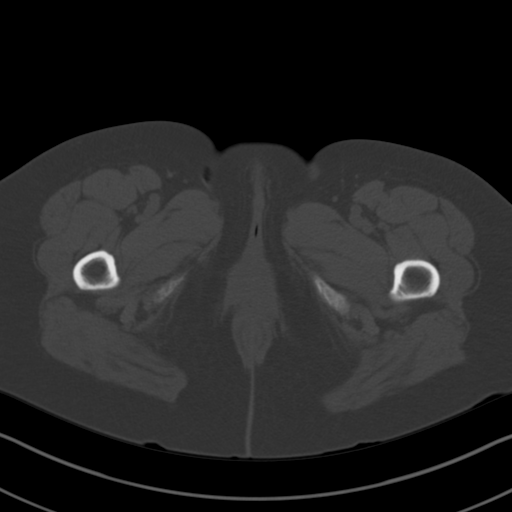
[im 11/94  soft-tissue]
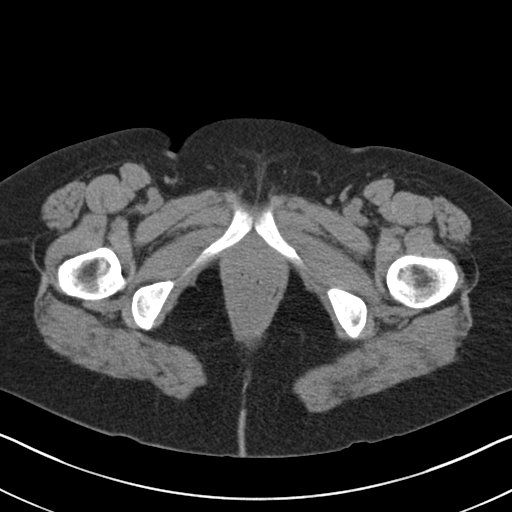
[im 21/94  soft-tissue]
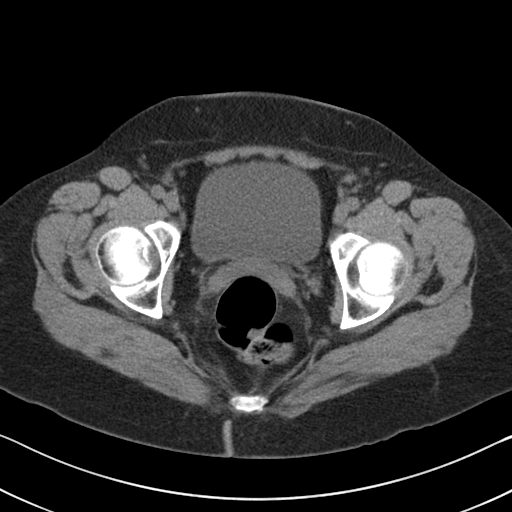
[im 26/94  soft-tissue]
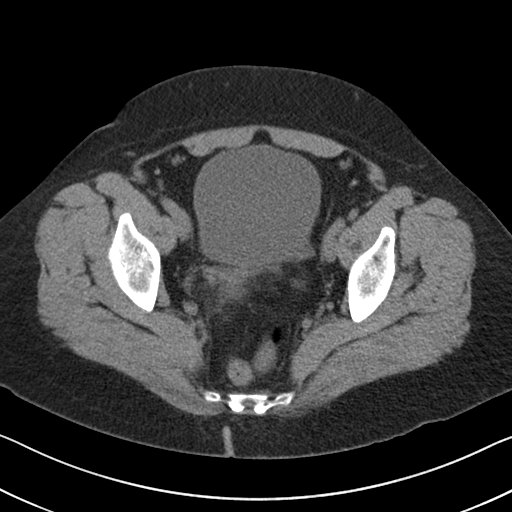
[im 32/94  soft-tissue]
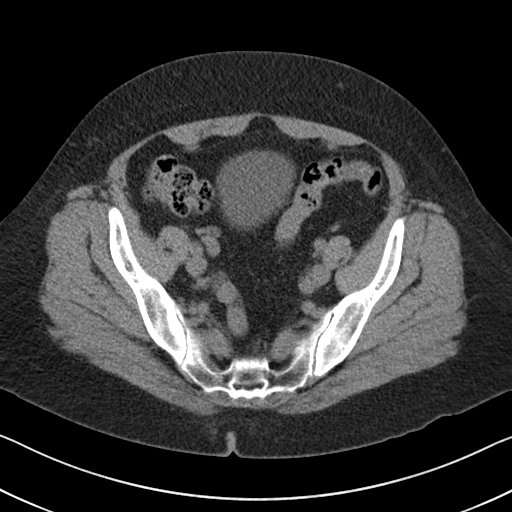
[im 42/94  soft-tissue]
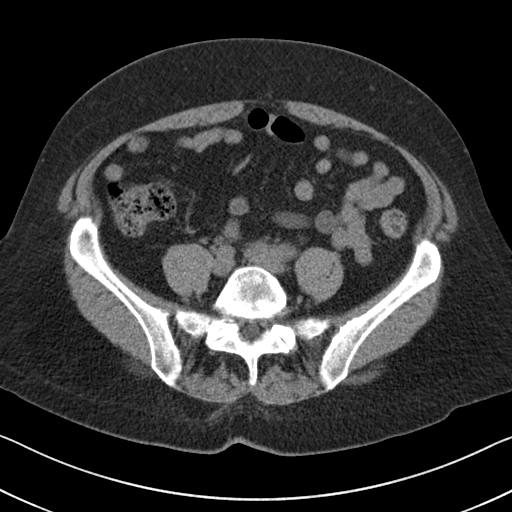
[im 47/94  soft-tissue]
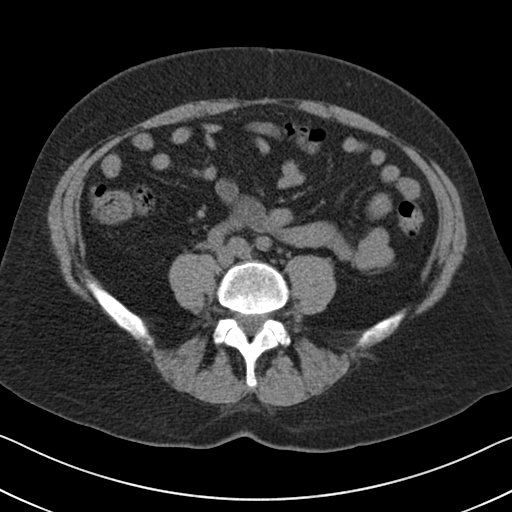
[im 52/94  soft-tissue]
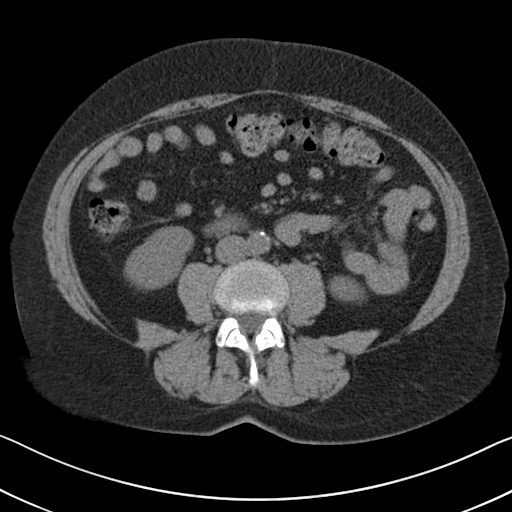
[im 63/94  soft-tissue]
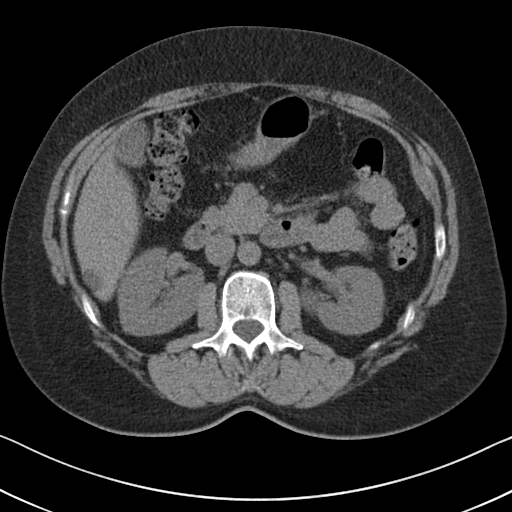
[im 63/94  bone]
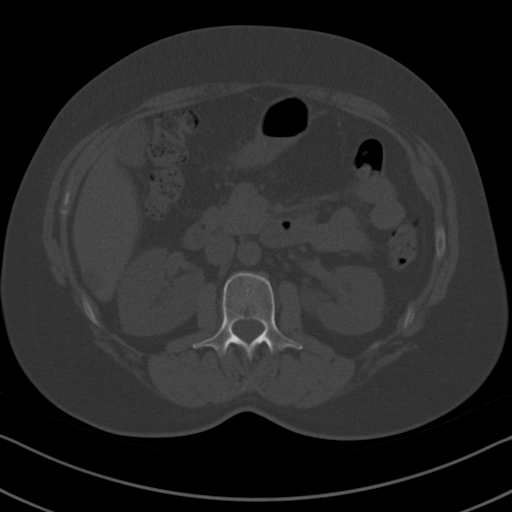
[im 68/94  soft-tissue]
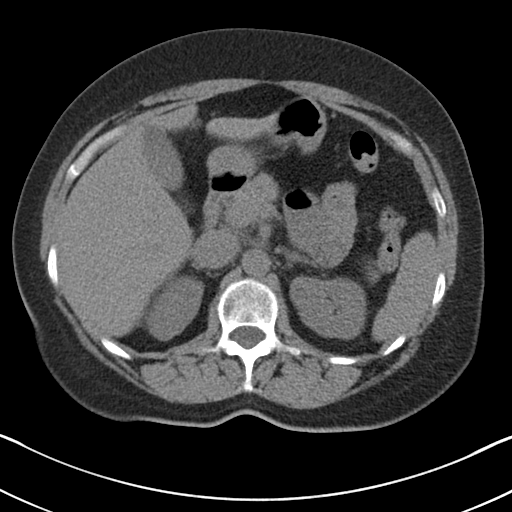
[im 73/94  soft-tissue]
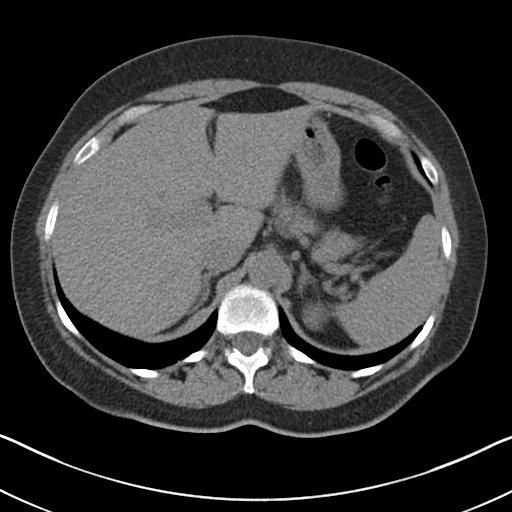
[im 83/94  soft-tissue]
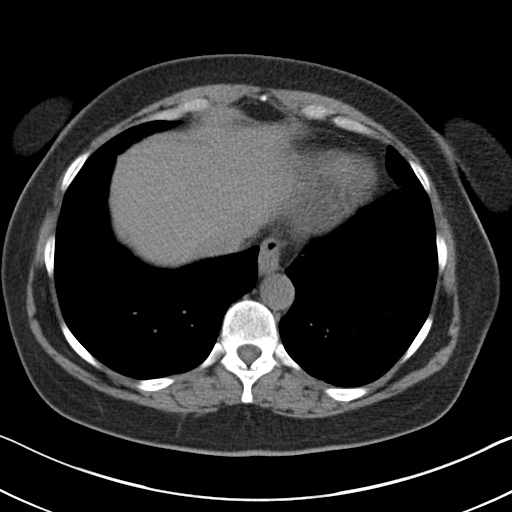
[im 88/94  soft-tissue]
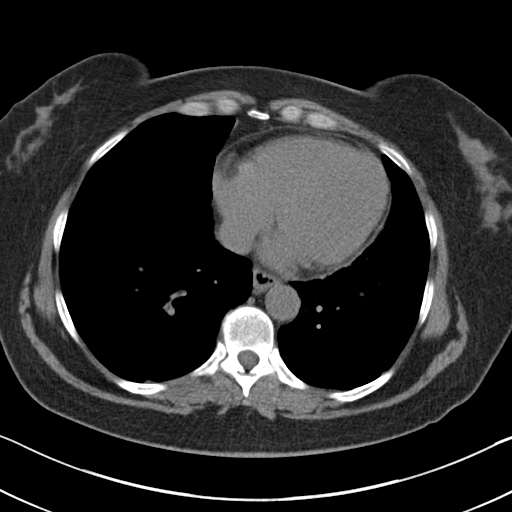

[Series 5: coronal · coronal · 0.75mm/px · 3 of 165 slices shown]
[im 55/165  soft-tissue]
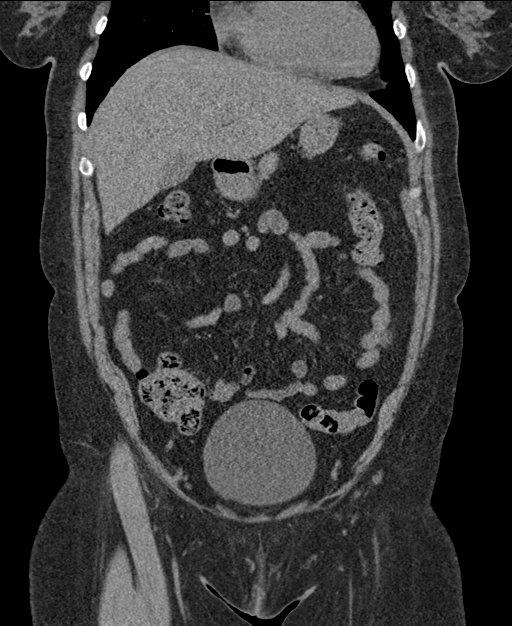
[im 73/165  soft-tissue]
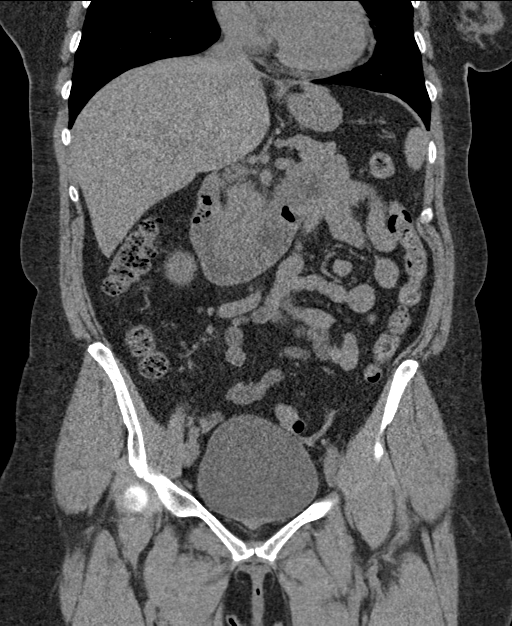
[im 92/165  soft-tissue]
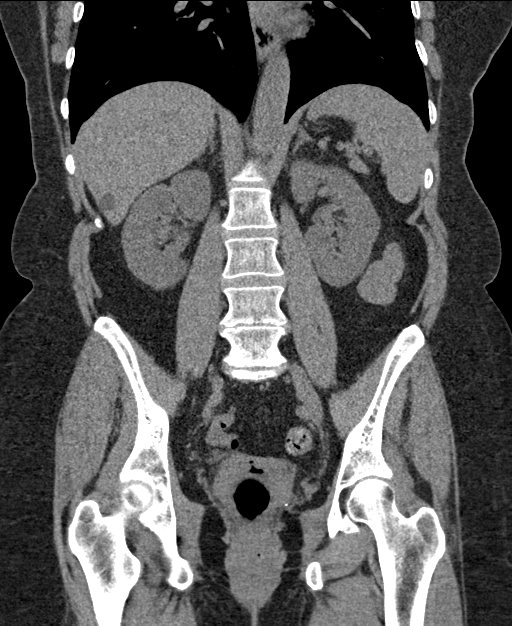

[16 of 46 positions shown; findings below may reference images not displayed]

FINDINGS: Lower chest: No acute findings.

Hepatobiliary: 1.4 cm cyst over the right lobe of the liver.
Gallbladder and biliary tree are normal.

Pancreas: Normal.

Spleen: Normal.

Adrenals/Urinary Tract: Adrenal glands are normal. Kidneys are
normal in size without hydronephrosis. There are several bilateral
small nonobstructing renal stones present. Ureters and bladder are
normal.

Stomach/Bowel: Stomach and small bowel are normal. Appendix is
normal. Very minimal diverticulosis over the sigmoid colon as the
colon is otherwise unremarkable.

Vascular/Lymphatic: Mild calcified plaque over the abdominal aorta
which is normal in caliber. No adenopathy.

Reproductive: Previous hysterectomy.

Other: No free fluid or focal inflammatory change.

Musculoskeletal: Moderate disc disease at the L4-5 and L5-S1 levels.
IMPRESSION: 1.  No acute findings in the abdomen/pelvis.

2. Bilateral small nonobstructing renal stones. No ureteral stones
or obstruction.

3. 1.4 cm hypodensity over the right lobe of the liver likely a
cyst.

4.  Minimal diverticulosis of the sigmoid colon.

5.  Aortic Atherosclerosis ([id number]).

6.  Degenerative disc disease at the L4-5 and L5-S1 levels.

## 2021-03-12 ENCOUNTER — Encounter: Payer: BC Managed Care – PPO | Admitting: Family Medicine

## 2021-03-19 ENCOUNTER — Encounter: Payer: BC Managed Care – PPO | Admitting: Family Medicine

## 2021-03-29 ENCOUNTER — Encounter: Payer: Self-pay | Admitting: Internal Medicine

## 2021-03-30 MED ORDER — VALACYCLOVIR HCL 500 MG PO TABS: 500.0000 mg | ORAL_TABLET | Freq: Two times a day (BID) | ORAL | 0 refills | Status: DC | PRN

## 2021-08-13 ENCOUNTER — Encounter: Payer: Self-pay | Admitting: Family Medicine

## 2021-08-13 ENCOUNTER — Other Ambulatory Visit: Payer: Self-pay

## 2021-08-13 ENCOUNTER — Ambulatory Visit (INDEPENDENT_AMBULATORY_CARE_PROVIDER_SITE_OTHER): Payer: BC Managed Care – PPO | Admitting: Family Medicine

## 2021-08-13 VITALS — BP 128/80 | HR 72 | Temp 98.2°F | Ht 64.5 in | Wt 180.0 lb

## 2021-08-13 DIAGNOSIS — Z0000 Encounter for general adult medical examination without abnormal findings: Secondary | ICD-10-CM

## 2021-08-13 DIAGNOSIS — Z111 Encounter for screening for respiratory tuberculosis: Secondary | ICD-10-CM

## 2021-08-13 NOTE — Patient Instructions (Addendum)
Consider shingrix, flu shot and 4th COVID dose.  Look into mammogram when you move.  Please call the location of your choice from the menu below to schedule your Mammogram and/or Bone Density appointment.    [facility]                      Phone:  [phone], [place] [address]                                                             Services: Traditional and 3D Mammogram, [place] Bone Density                 Phone: [phone] [address], [place] [address]    Service: Bone Density ONLY   *this site does NOT perform mammograms  [facility]                        Phone:  [phone] [address], [place] [address]                                            Services:  3D Mammogram and [place] at [facility]   Phone:  [phone]   [address], [place] [address]                                            Services: 3D Mammogram and [place]  [place] at [facility])  Phone:  [phone]   [address] [place], [place] [address]  Services:  3D Mammogram and Bone Density

## 2021-08-13 NOTE — Progress Notes (Signed)
Patient ID: Megan Mcdonald, female    DOB: 05-17-1961, 60 y.o.   MRN: [id number]  This visit was conducted in person.  BP 128/80   Pulse 72   Temp 98.2 F (36.8 C) (Temporal)   Ht 5' 4.5" (1.638 m)   Wt 180 lb (81.6 kg)   SpO2 98%   BMI 30.42 kg/m    CC: Chief Complaint  Patient presents with   Annual Exam    Subjective:   HPI: Megan Mcdonald is a 60 y.o. female presenting on 08/13/2021 for Annual Exam  PCP was  [name] She is going to establish with new provider when  she moves.   She is moving this month to start a new job.. 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 TB test placed. Daughter is Therapist, sports... and will verify test is negative.  She is not interested in labs eval.. had done on line.  Diet: healthy eating Exercise: walking at work [employer] Readings from Last 3 Encounters:  08/13/21 180 lb (81.6 kg)  11/05/19 184 lb (83.5 kg)  07/09/19 184 lb 12.8 oz (83.8 kg)      On estradiol for menopausal syndrome  Relevant past medical, surgical, family and social history reviewed and updated as indicated. Interim medical history since our last visit reviewed. Allergies and medications reviewed and updated. Outpatient Medications Prior to Visit  Medication Sig Dispense Refill   estradiol (VIVELLE-DOT) 0.1 MG/24HR patch PLACE 1 NEW PATCH ON THE SKIN TWICE A WEEK 24 patch 0   valACYclovir (VALTREX) 500 MG tablet Take 1 tablet (500 mg total) by mouth 2 (two) times daily as needed (outbreak x3 days). 20 tablet 0   cephALEXin (KEFLEX) 500 MG capsule Take 1 capsule (500 mg total) by mouth 4 (four) times daily. 28 capsule 0   diphenhydrAMINE (BENADRYL) 25 MG tablet Take 25 mg by mouth as needed.      hydrOXYzine (ATARAX/VISTARIL) 10 MG tablet Take 1 tablet (10 mg total) by mouth 2 (two) times daily as needed. (Patient not taking: No sig reported) 60 tablet 0   oxyCODONE-acetaminophen (PERCOCET/ROXICET) 5-325 MG tablet Take 1-2 tablets by mouth every 6 (six) hours as  needed for severe pain. 12 tablet 0   No facility-administered medications prior to visit.     Per HPI unless specifically indicated in ROS section below Review of Systems  Constitutional:  Negative for fatigue and fever.  HENT:  Negative for congestion.   Eyes:  Negative for pain.  Respiratory:  Negative for cough and shortness of breath.   Cardiovascular:  Negative for chest pain, palpitations and leg swelling.  Gastrointestinal:  Negative for abdominal pain.  Genitourinary:  Negative for dysuria and vaginal bleeding.  Musculoskeletal:  Negative for back pain.  Neurological:  Negative for syncope, light-headedness and headaches.  Psychiatric/Behavioral:  Negative for dysphoric mood.   Objective:  BP 128/80   Pulse 72   Temp 98.2 F (36.8 C) (Temporal)   Ht 5' 4.5" (1.638 m)   Wt 180 lb (81.6 kg)   SpO2 98%   BMI 30.42 kg/m   Wt Readings from Last 3 Encounters:  08/13/21 180 lb (81.6 kg)  11/05/19 184 lb (83.5 kg)  07/09/19 184 lb 12.8 oz (83.8 kg)      Physical Exam Vitals and nursing note reviewed.  Constitutional:      General: She is not in acute distress.    Appearance: Normal appearance. She is well-developed. She is not ill-appearing  or toxic-appearing.  HENT:     Head: Normocephalic.     Right Ear: Hearing, tympanic membrane, ear canal and external ear normal.     Left Ear: Hearing, tympanic membrane, ear canal and external ear normal.     Nose: Nose normal.  Eyes:     General: Lids are normal. Lids are everted, no foreign bodies appreciated.     Conjunctiva/sclera: Conjunctivae normal.     Pupils: Pupils are equal, round, and reactive to light.  Neck:     Thyroid: No thyroid mass or thyromegaly.     Vascular: No carotid bruit.     Trachea: Trachea normal.  Cardiovascular:     Rate and Rhythm: Normal rate and regular rhythm.     Heart sounds: Normal heart sounds, S1 normal and S2 normal. No murmur heard.   No gallop.  Pulmonary:     Effort: Pulmonary  effort is normal. No respiratory distress.     Breath sounds: Normal breath sounds. No wheezing, rhonchi or rales.  Abdominal:     General: Bowel sounds are normal. There is no distension or abdominal bruit.     Palpations: Abdomen is soft. There is no fluid wave or mass.     Tenderness: There is no abdominal tenderness. There is no guarding or rebound.     Hernia: No hernia is present.  Musculoskeletal:     Cervical back: Normal range of motion and neck supple.  Lymphadenopathy:     Cervical: No cervical adenopathy.  Skin:    General: Skin is warm and dry.     Findings: No rash.  Neurological:     Mental Status: She is alert.     Cranial Nerves: No cranial nerve deficit.     Sensory: No sensory deficit.  Psychiatric:        Mood and Affect: Mood is not anxious or depressed.        Speech: Speech normal.        Behavior: Behavior normal. Behavior is cooperative.        Judgment: Judgment normal.      Results for orders placed or performed during the hospital encounter of 05/28/20  Urine culture   Specimen: Urine, Random  Result Value Ref Range   Specimen Description      URINE, RANDOM Performed at [facility], [place] [address], [place], [place] [address]    Special Requests      NONE Performed at [facility], [place] [address] [place], [place] [address]    Culture 70,000 COLONIES/mL KLEBSIELLA PNEUMONIAE (A)    Report Status 05/31/2020 FINAL    Organism ID, Bacteria KLEBSIELLA PNEUMONIAE (A)       Susceptibility   Klebsiella pneumoniae - MIC*    AMPICILLIN RESISTANT Resistant     CEFAZOLIN <=4 SENSITIVE Sensitive     CEFTRIAXONE <=0.25 SENSITIVE Sensitive     CIPROFLOXACIN <=0.25 SENSITIVE Sensitive     GENTAMICIN <=1 SENSITIVE Sensitive     IMIPENEM <=0.25 SENSITIVE Sensitive     NITROFURANTOIN 128 RESISTANT Resistant     TRIMETH/SULFA <=20 SENSITIVE Sensitive     AMPICILLIN/SULBACTAM <=2 SENSITIVE Sensitive     PIP/TAZO <=4  SENSITIVE Sensitive     * 70,000 COLONIES/mL KLEBSIELLA PNEUMONIAE  Urinalysis, Routine w reflex microscopic- may I&O cath if menses  Result Value Ref Range   Color, Urine YELLOW YELLOW   APPearance CLEAR CLEAR   Specific Gravity, Urine 1.014 1.005 - 1.030   pH 8.0  5.0 - 8.0   Glucose, UA NEGATIVE NEGATIVE mg/dL   Hgb urine dipstick NEGATIVE NEGATIVE   Bilirubin Urine NEGATIVE NEGATIVE   Ketones, ur NEGATIVE NEGATIVE mg/dL   Protein, ur NEGATIVE NEGATIVE mg/dL   Nitrite NEGATIVE NEGATIVE   Leukocytes,Ua TRACE (A) NEGATIVE   RBC / HPF 0-5 0 - 5 RBC/hpf   WBC, UA 21-50 0 - 5 WBC/hpf   Bacteria, UA RARE (A) NONE SEEN   Squamous Epithelial / LPF 0-5 0 - 5   Mucus PRESENT   Comprehensive metabolic panel  Result Value Ref Range   Sodium 139 135 - 145 mmol/L   Potassium 3.9 3.5 - 5.1 mmol/L   Chloride 106 98 - 111 mmol/L   CO2 25 22 - 32 mmol/L   Glucose, Bld 86 70 - 99 mg/dL   BUN 15 6 - 20 mg/dL   Creatinine, Ser 0.66 0.44 - 1.00 mg/dL   Calcium 8.8 (L) 8.9 - 10.3 mg/dL   Total Protein 7.6 6.5 - 8.1 g/dL   Albumin 4.2 3.5 - 5.0 g/dL   AST 23 15 - 41 U/L   ALT 24 0 - 44 U/L   Alkaline Phosphatase 77 38 - 126 U/L   Total Bilirubin 0.5 0.3 - 1.2 mg/dL   GFR calc non Af Amer >60 >60 mL/min   GFR calc Af Amer >60 >60 mL/min   Anion gap 8 5 - 15  CBC with Differential  Result Value Ref Range   WBC 8.1 4.0 - 10.5 K/uL   RBC 4.73 3.87 - 5.11 MIL/uL   Hemoglobin 13.7 12.0 - 15.0 g/dL   HCT 42.4 36.0 - 46.0 %   MCV 89.6 80.0 - 100.0 fL   MCH 29.0 26.0 - 34.0 pg   MCHC 32.3 30.0 - 36.0 g/dL   RDW 13.0 11.5 - 15.5 %   Platelets 248 150 - 400 K/uL   nRBC 0.0 0.0 - 0.2 %   Neutrophils Relative % 65 %   Neutro Abs 5.3 1.7 - 7.7 K/uL   Lymphocytes Relative 26 %   Lymphs Abs 2.1 0.7 - 4.0 K/uL   Monocytes Relative 7 %   Monocytes Absolute 0.6 0.1 - 1.0 K/uL   Eosinophils Relative 1 %   Eosinophils Absolute 0.1 0.0 - 0.5 K/uL   Basophils Relative 1 %   Basophils Absolute 0.0 0.0  - 0.1 K/uL   Immature Granulocytes 0 %   Abs Immature Granulocytes 0.01 0.00 - 0.07 K/uL    This visit occurred during the SARS-CoV-2 public health emergency.  Safety protocols were in place, including screening questions prior to the visit, additional usage of staff PPE, and extensive cleaning of exam room while observing appropriate contact time as indicated for disinfecting solutions.   COVID 19 screen:  No recent travel or known exposure to COVID19 The patient denies respiratory symptoms of COVID 19 at this time. The importance of social distancing was discussed today.   Assessment and Plan The patient's preventative maintenance and recommended screening tests for an annual wellness exam were reviewed in full today. Brought up to date unless services declined.  Counselled on the importance of diet, exercise, and its role in overall health and mortality. The patient's FH and SH was reviewed, including their home life, tobacco status, and drug and alcohol status.   Vaccines: refuses flu shot,  COVID x 3, uptodate with TDAP Pap/DVE:  partial hysterectomy... noncancer Mammo:  due  Bone Density: on estrogen Colon: DUE, per pt negative  stool test online.  Smoking Status:none ETOH/ drug use:  once a month/none      Eliezer Lofts, MD

## 2021-08-16 LAB — TB SKIN TEST
Induration: 0 mm
TB Skin Test: NEGATIVE

## 2021-12-15 ENCOUNTER — Other Ambulatory Visit: Payer: Self-pay | Admitting: Family Medicine

## 2021-12-15 ENCOUNTER — Encounter: Payer: Self-pay | Admitting: Family Medicine

## 2021-12-16 MED ORDER — VALACYCLOVIR HCL 500 MG PO TABS: 500.0000 mg | ORAL_TABLET | Freq: Two times a day (BID) | ORAL | 0 refills | Status: AC | PRN

## 2021-12-16 NOTE — Telephone Encounter (Signed)
Last office visit 08/13/2021 for CPE with Dr. Diona Browner.  Last refilled 03/30/2021 for #20 with no refills.  No TOC appointment.  Ok to refill?

## 2022-01-27 ENCOUNTER — Encounter: Payer: Self-pay | Admitting: Family Medicine

## 2023-10-31 ENCOUNTER — Encounter (HOSPITAL_COMMUNITY): Payer: Self-pay | Admitting: Emergency Medicine

## 2023-10-31 ENCOUNTER — Other Ambulatory Visit: Payer: Self-pay

## 2023-10-31 ENCOUNTER — Emergency Department (HOSPITAL_COMMUNITY)
Admission: EM | Admit: 2023-10-31 | Discharge: 2023-10-31 | Discharge status: Against Medical Advice | Payer: BC Managed Care – PPO | Attending: Emergency Medicine | Admitting: Emergency Medicine

## 2023-10-31 DIAGNOSIS — R110 Nausea: Secondary | ICD-10-CM | POA: Diagnosis not present

## 2023-10-31 DIAGNOSIS — R197 Diarrhea, unspecified: Secondary | ICD-10-CM | POA: Insufficient documentation

## 2023-10-31 DIAGNOSIS — Z5321 Procedure and treatment not carried out due to patient leaving prior to being seen by health care provider: Secondary | ICD-10-CM | POA: Diagnosis not present

## 2023-10-31 DIAGNOSIS — R1032 Left lower quadrant pain: Secondary | ICD-10-CM | POA: Diagnosis present

## 2023-10-31 LAB — CBC WITH DIFFERENTIAL/PLATELET
Abs Immature Granulocytes: 0.06 10*3/uL (ref 0.00–0.07)
Basophils Absolute: 0.1 10*3/uL (ref 0.0–0.1)
Basophils Relative: 0 %
Eosinophils Absolute: 0.1 10*3/uL (ref 0.0–0.5)
Eosinophils Relative: 1 %
HCT: 45.5 % (ref 36.0–46.0)
Hemoglobin: 14.9 g/dL (ref 12.0–15.0)
Immature Granulocytes: 0 %
Lymphocytes Relative: 12 %
Lymphs Abs: 1.7 10*3/uL (ref 0.7–4.0)
MCH: 29.2 pg (ref 26.0–34.0)
MCHC: 32.7 g/dL (ref 30.0–36.0)
MCV: 89.2 fL (ref 80.0–100.0)
Monocytes Absolute: 0.8 10*3/uL (ref 0.1–1.0)
Monocytes Relative: 5 %
Neutro Abs: 12 10*3/uL — ABNORMAL HIGH (ref 1.7–7.7)
Neutrophils Relative %: 82 %
Platelets: 268 10*3/uL (ref 150–400)
RBC: 5.1 MIL/uL (ref 3.87–5.11)
RDW: 13 % (ref 11.5–15.5)
WBC: 14.6 10*3/uL — ABNORMAL HIGH (ref 4.0–10.5)
nRBC: 0 % (ref 0.0–0.2)

## 2023-10-31 LAB — COMPREHENSIVE METABOLIC PANEL
ALT: 26 U/L (ref 0–44)
AST: 21 U/L (ref 15–41)
Albumin: 4.1 g/dL (ref 3.5–5.0)
Alkaline Phosphatase: 89 U/L (ref 38–126)
Anion gap: 11 (ref 5–15)
BUN: 21 mg/dL (ref 8–23)
CO2: 23 mmol/L (ref 22–32)
Calcium: 9.2 mg/dL (ref 8.9–10.3)
Chloride: 104 mmol/L (ref 98–111)
Creatinine, Ser: 0.93 mg/dL (ref 0.44–1.00)
GFR, Estimated: >60 mL/min (ref >60)
Glucose, Bld: 100 mg/dL — ABNORMAL HIGH (ref 70–99)
Potassium: 4.1 mmol/L (ref 3.5–5.1)
Sodium: 138 mmol/L (ref 135–145)
Total Bilirubin: 0.7 mg/dL (ref <1.2)
Total Protein: 7.9 g/dL (ref 6.5–8.1)

## 2023-10-31 LAB — LIPASE, BLOOD: Lipase: 32 U/L (ref 11–51)

## 2023-10-31 MED ORDER — ONDANSETRON 4 MG PO TBDP
4.0000 mg | ORAL_TABLET | Freq: Once | ORAL | Status: AC
  Administered 2023-10-31: 4 mg via ORAL
  Filled 2023-10-31: qty 1

## 2023-10-31 NOTE — ED Triage Notes (Signed)
Pt. Arrived POV. A&O x4.Pt. states she is having left lower abd pain. Started today at 4pm. States she is having N/v/d.

## 2023-10-31 NOTE — ED Provider Triage Note (Signed)
Emergency Medicine Provider Triage Evaluation Note  Megan Mcdonald , a 61 y.o. female  was evaluated in triage.  Pt complains of left lower quadrant abdominal pain.  Reports history of the same and was diagnosed with colitis some years ago.  States that she had sudden onset of abdominal pain at 4 PM which was a 10 out of 10 described as stabbing.  States it is decreased to 8 out of 10 at this time.  Endorsing nausea without vomiting as well as large amount of diarrhea.  Unsure if there was blood in her stool.  Denies dysuria, fevers, chest pain or shortness of breath, flank pain.  Review of Systems  Positive:  Negative:   Physical Exam  BP 135/69 (BP Location: Right Arm)   Pulse 71   Temp 98 F (36.7 C) (Oral)   Resp 20   Ht 5\' 4"  (1.626 m)   Wt 80.3 kg   SpO2 99%   BMI 30.38 kg/m  Gen:   Awake, no distress   Resp:  Normal effort  MSK:   Moves extremities without difficulty  Other:    Medical Decision Making  Medically screening exam initiated at 4:59 PM.  Appropriate orders placed.  Megan Mcdonald was informed that the remainder of the evaluation will be completed by another provider, this initial triage assessment does not replace that evaluation, and the importance of remaining in the ED until their evaluation is complete.     Al Decant, PA-C 10/31/23 1700

## 2023-11-06 ENCOUNTER — Emergency Department (HOSPITAL_COMMUNITY)
Admission: EM | Admit: 2023-11-06 | Discharge: 2023-11-06 | Disposition: A | Discharge status: Home / Self Care | Payer: BC Managed Care – PPO | Attending: Emergency Medicine | Admitting: Emergency Medicine

## 2023-11-06 ENCOUNTER — Encounter (HOSPITAL_COMMUNITY): Payer: Self-pay

## 2023-11-06 ENCOUNTER — Other Ambulatory Visit: Payer: Self-pay

## 2023-11-06 ENCOUNTER — Emergency Department (HOSPITAL_COMMUNITY): Payer: BC Managed Care – PPO

## 2023-11-06 DIAGNOSIS — R319 Hematuria, unspecified: Secondary | ICD-10-CM | POA: Diagnosis present

## 2023-11-06 DIAGNOSIS — N179 Acute kidney failure, unspecified: Secondary | ICD-10-CM | POA: Diagnosis not present

## 2023-11-06 DIAGNOSIS — N200 Calculus of kidney: Secondary | ICD-10-CM | POA: Diagnosis not present

## 2023-11-06 DIAGNOSIS — Z79899 Other long term (current) drug therapy: Secondary | ICD-10-CM | POA: Diagnosis not present

## 2023-11-06 DIAGNOSIS — I10 Essential (primary) hypertension: Secondary | ICD-10-CM | POA: Diagnosis not present

## 2023-11-06 LAB — URINALYSIS, ROUTINE W REFLEX MICROSCOPIC
Bilirubin Urine: NEGATIVE
Glucose, UA: NEGATIVE mg/dL
Ketones, ur: 20 mg/dL — AB
Nitrite: NEGATIVE
Protein, ur: 100 mg/dL — AB
RBC / HPF: >50 RBC/hpf (ref 0–5)
Specific Gravity, Urine: 1.018 (ref 1.005–1.030)
WBC, UA: >50 WBC/hpf (ref 0–5)
pH: 6 (ref 5.0–8.0)

## 2023-11-06 LAB — CBC
HCT: 43.1 % (ref 36.0–46.0)
Hemoglobin: 14.1 g/dL (ref 12.0–15.0)
MCH: 29.1 pg (ref 26.0–34.0)
MCHC: 32.7 g/dL (ref 30.0–36.0)
MCV: 89 fL (ref 80.0–100.0)
Platelets: 272 10*3/uL (ref 150–400)
RBC: 4.84 MIL/uL (ref 3.87–5.11)
RDW: 12.9 % (ref 11.5–15.5)
WBC: 12.7 10*3/uL — ABNORMAL HIGH (ref 4.0–10.5)
nRBC: 0 % (ref 0.0–0.2)

## 2023-11-06 LAB — COMPREHENSIVE METABOLIC PANEL
ALT: 17 U/L (ref 0–44)
AST: 17 U/L (ref 15–41)
Albumin: 4.2 g/dL (ref 3.5–5.0)
Alkaline Phosphatase: 73 U/L (ref 38–126)
Anion gap: 9 (ref 5–15)
BUN: 20 mg/dL (ref 8–23)
CO2: 24 mmol/L (ref 22–32)
Calcium: 9.4 mg/dL (ref 8.9–10.3)
Chloride: 106 mmol/L (ref 98–111)
Creatinine, Ser: 1.2 mg/dL — ABNORMAL HIGH (ref 0.44–1.00)
GFR, Estimated: 51 mL/min — ABNORMAL LOW (ref >60)
Glucose, Bld: 91 mg/dL (ref 70–99)
Potassium: 3.9 mmol/L (ref 3.5–5.1)
Sodium: 139 mmol/L (ref 135–145)
Total Bilirubin: 0.6 mg/dL (ref <1.2)
Total Protein: 7.7 g/dL (ref 6.5–8.1)

## 2023-11-06 LAB — LIPASE, BLOOD: Lipase: 31 U/L (ref 11–51)

## 2023-11-06 MED ORDER — FENTANYL CITRATE PF 50 MCG/ML IJ SOSY
50.0000 ug | PREFILLED_SYRINGE | Freq: Once | INTRAMUSCULAR | Status: AC
  Administered 2023-11-06: 50 ug via INTRAVENOUS
  Filled 2023-11-06: qty 1

## 2023-11-06 MED ORDER — TAMSULOSIN HCL 0.4 MG PO CAPS: 0.4000 mg | ORAL_CAPSULE | Freq: Every day | ORAL | 0 refills | Status: AC

## 2023-11-06 MED ORDER — HYDROCODONE-ACETAMINOPHEN 5-325 MG PO TABS: 1.0000 | ORAL_TABLET | Freq: Four times a day (QID) | ORAL | 0 refills | Status: AC | PRN

## 2023-11-06 MED ORDER — TAMSULOSIN HCL 0.4 MG PO CAPS
0.4000 mg | ORAL_CAPSULE | Freq: Once | ORAL | Status: AC
  Administered 2023-11-06: 0.4 mg via ORAL
  Filled 2023-11-06: qty 1

## 2023-11-06 NOTE — Discharge Instructions (Addendum)
You have a 5 mm stone that is trying to pass.  I have prescribed an opioid for pain control as you have a acute kidney injury and I do not recommend you use ibuprofen.  Please follow-up with your PCP to recheck this and to discuss high blood pressure.  Additionally, please follow-up with urology for your kidney stones.  Should you experience fever or pain that is uncontrolled by medications prescribed, please return.

## 2023-11-06 NOTE — ED Provider Notes (Signed)
[place] EMERGENCY DEPARTMENT AT [facility] Provider Note   CSN: [id number] Arrival date & time: 11/06/23  1610   History  Chief Complaint  Patient presents with   Hematuria    Megan Mcdonald is a 62 y.o. female.  62 year old female presenting with acute onset right flank pain radiating to 3 PM today.  She went to her Pilates class and went to urinate and saw that it was brown. She denies dysuria and nausea/vomiting.  She states the pain is intermittent and can be as high as 15/10.  She has not taken anything for pain.  She is unaware if she has passed a kidney stone in the past.   Hematuria       Home Medications Prior to Admission medications   Medication Sig Start Date End Date Taking? Authorizing Provider  HYDROcodone-acetaminophen (NORCO/VICODIN) 5-325 MG tablet Take 1 tablet by mouth every 6 (six) hours as needed for moderate pain (pain score 4-6). 11/06/23 11/05/24 Yes Shelby Mattocks, DO  tamsulosin (FLOMAX) 0.4 MG CAPS capsule Take 1 capsule (0.4 mg total) by mouth daily. 11/06/23  Yes Shelby Mattocks, DO  estradiol (VIVELLE-DOT) 0.1 MG/24HR patch PLACE 1 NEW PATCH ON THE SKIN TWICE A WEEK 08/24/20   Lorre Munroe, NP  valACYclovir (VALTREX) 500 MG tablet Take 1 tablet (500 mg total) by mouth 2 (two) times daily as needed (outbreak x3 days). 12/16/21   Eulis Foster, FNP      Allergies    Molds & smuts    Review of Systems   Review of Systems  Genitourinary:  Positive for hematuria.   Physical Exam Updated Vital Signs BP (!) 158/88   Pulse 89   Temp 98 F (36.7 C)   Resp 20   Wt 80 kg   SpO2 95%   BMI 30.27 kg/m  Physical Exam Abdominal:     General: Abdomen is flat. Bowel sounds are normal.     Palpations: Abdomen is soft.     Tenderness: There is abdominal tenderness (suprapubic). There is right CVA tenderness. There is no left CVA tenderness.    ED Results / Procedures / Treatments   Labs (all labs ordered are listed, but only  abnormal results are displayed) Labs Reviewed  URINALYSIS, ROUTINE W REFLEX MICROSCOPIC - Abnormal; Notable for the following components:      Result Value   APPearance HAZY (*)    Hgb urine dipstick LARGE (*)    Ketones, ur 20 (*)    Protein, ur 100 (*)    Leukocytes,Ua LARGE (*)    Bacteria, UA RARE (*)    All other components within normal limits  CBC - Abnormal; Notable for the following components:   WBC 12.7 (*)    All other components within normal limits  COMPREHENSIVE METABOLIC PANEL - Abnormal; Notable for the following components:   Creatinine, Ser 1.20 (*)    GFR, Estimated 51 (*)    All other components within normal limits  LIPASE, BLOOD  URINALYSIS, W/ REFLEX TO CULTURE (INFECTION SUSPECTED)    EKG None  Radiology CT Renal Stone Study  Result Date: 11/06/2023 CLINICAL DATA:  Right flank pain radiating to the right abdomen. Hematuria. EXAM: CT ABDOMEN AND PELVIS WITHOUT CONTRAST TECHNIQUE: Multidetector CT imaging of the abdomen and pelvis was performed following the standard protocol without IV contrast. RADIATION DOSE REDUCTION: This exam was performed according to the departmental dose-optimization program which includes automated exposure control, adjustment of the mA and/or kV according  to patient size and/or use of iterative reconstruction technique. COMPARISON:  05/28/2020 CT abdomen/pelvis. FINDINGS: Lower chest: No significant pulmonary nodules or acute consolidative airspace disease. Hepatobiliary: Normal liver size. Simple 2.0 cm inferior right liver cyst. Normal gallbladder with no radiopaque cholelithiasis. No biliary ductal dilatation. Pancreas: Normal, with no mass or duct dilation. Spleen: Normal size. No mass. Adrenals/Urinary Tract: Normal adrenals. Obstructing 5 mm right mid lumbar ureteral stone with mild to moderate right hydroureteronephrosis. Numerous additional tiny nonobstructing stones scattered throughout the right kidney, at least 8, largest 2 mm  in the lower right kidney. Several, at least 5, tiny nonobstructing left renal stones, largest 3 mm in the upper left kidney. No left hydronephrosis. No contour deforming renal masses. Normal caliber left ureter. No additional ureteral stones. Normal bladder. Stomach/Bowel: Small hiatal hernia. Otherwise normal nondistended stomach. Normal caliber small bowel with no small bowel wall thickening. Normal appendix. Mild left colonic diverticulosis with no large bowel wall thickening or significant pericolonic fat stranding. Vascular/Lymphatic: Atherosclerotic nonaneurysmal abdominal aorta. No pathologically enlarged lymph nodes in the abdomen or pelvis. Reproductive: Status post hysterectomy, with no abnormal findings at the vaginal cuff. No adnexal mass. Other: No pneumoperitoneum, ascites or focal fluid collection. Musculoskeletal: No aggressive appearing focal osseous lesions. Mild thoracolumbar spondylosis, most prominent at L5-S1. IMPRESSION: 1. Obstructing 5 mm right mid lumbar ureteral stone with mild to moderate right hydroureteronephrosis. 2. Numerous additional tiny nonobstructing bilateral renal stones. 3. Small hiatal hernia. 4. Mild left colonic diverticulosis. 5.  Aortic Atherosclerosis (ICD10-I70.0). Electronically Signed   By: Delbert Phenix M.D.   On: 11/06/2023 19:52    Procedures Procedures    Medications Ordered in ED Medications  fentaNYL (SUBLIMAZE) injection 50 mcg (50 mcg Intravenous Given 11/06/23 1852)  tamsulosin (FLOMAX) capsule 0.4 mg (0.4 mg Oral Given 11/06/23 1840)    ED Course/ Medical Decision Making/ A&P                                 Medical Decision Making 62 year old female presenting with acute onset right flank pain with hematuria.  Differential diagnosis includes kidney stone, pyelonephritis, cystitis, PID, viral gastro, constipation.  Given the acute nature, highly suspect this represents ureterolithiasis. Will treat pain and start flomax while awaiting CT renal  study to determine if stone is present and need for urology. CBC, CMP, lipase reveal AKI with Cr 1.2 with baseline ~0.90. UA with RBC and WBC >50, large hemoglobin, rare bacteria. CT renal study reveals obstructing 5 mm right mid lumbar ureteral stone with mild to moderate right hydroureteronephrosis and numerous additional tiny nonobstructing bilateral renal stones.  Pain improved with fentanyl.  Given stone is very recently 5 mm, it should pass with assistance from Flomax.  Given her finding of several nonobstructing stones, recommend routine follow-up with urology. Pain control with Norco 5-325, avoid NSAIDs in the setting of AKI. Urine reflex to culture given the presence of WBC.  Return precautions discussed, stable for discharge. Follow up with PCP for BMP recheck for AKI.   Secondarily, she presented with elevated BP at 211/100 without hx of HTN. Recheck was 164/95. This is quite high in someone without formal diagnosis, I suspect she has underlying HTN even though acute pain can explain rise in BP. Recommend following up with PCP for this.   Amount and/or Complexity of Data Reviewed Labs: ordered.        Final Clinical Impression(s) / ED Diagnoses Final diagnoses:  AKI (acute kidney injury) (HCC)  Kidney stone  Hypertension, unspecified type    Rx / DC Orders ED Discharge Orders          Ordered    tamsulosin (FLOMAX) 0.4 MG CAPS capsule  Daily        11/06/23 2018    Ambulatory referral to Urology        11/06/23 2018    HYDROcodone-acetaminophen (NORCO/VICODIN) 5-325 MG tablet  Every 6 hours PRN        11/06/23 2018              Shelby Mattocks, DO 11/06/23 2054    Gwyneth Sprout, MD 11/06/23 2349

## 2023-11-06 NOTE — ED Triage Notes (Signed)
C/o right flank pain radiating to right abd and hematuria that started 2 hrs PTA.

## 2023-11-29 ENCOUNTER — Other Ambulatory Visit: Payer: Self-pay | Admitting: Student

## 2024-01-29 ENCOUNTER — Other Ambulatory Visit: Payer: Self-pay | Admitting: Neurology

## 2024-01-29 DIAGNOSIS — R519 Headache, unspecified: Secondary | ICD-10-CM

## 2024-01-29 DIAGNOSIS — H53149 Visual discomfort, unspecified: Secondary | ICD-10-CM

## 2024-01-29 DIAGNOSIS — R110 Nausea: Secondary | ICD-10-CM

## 2024-02-03 ENCOUNTER — Ambulatory Visit
Admission: RE | Admit: 2024-02-03 | Discharge: 2024-02-03 | Disposition: A | Discharge status: Home / Self Care | Payer: 59 | Source: Ambulatory Visit | Attending: Neurology | Admitting: Neurology

## 2024-02-03 DIAGNOSIS — R519 Headache, unspecified: Secondary | ICD-10-CM | POA: Diagnosis present

## 2024-02-03 DIAGNOSIS — H53149 Visual discomfort, unspecified: Secondary | ICD-10-CM | POA: Insufficient documentation

## 2024-02-03 DIAGNOSIS — R110 Nausea: Secondary | ICD-10-CM | POA: Insufficient documentation

## 2024-08-09 ENCOUNTER — Other Ambulatory Visit: Payer: Self-pay | Admitting: Physician Assistant

## 2024-08-09 DIAGNOSIS — Z1231 Encounter for screening mammogram for malignant neoplasm of breast: Secondary | ICD-10-CM

## 2024-09-12 ENCOUNTER — Ambulatory Visit
Admission: RE | Admit: 2024-09-12 | Discharge: 2024-09-12 | Disposition: A | Discharge status: Home / Self Care | Source: Ambulatory Visit | Attending: Physician Assistant | Admitting: Physician Assistant

## 2024-09-12 DIAGNOSIS — Z1231 Encounter for screening mammogram for malignant neoplasm of breast: Secondary | ICD-10-CM
